# Patient Record
Sex: Male | Born: 1985 | Hispanic: No | Marital: Single | Smoking: Former smoker
Health system: Southern US, Community
[De-identification: ages and names within clinical notes are randomized; demographics above are authoritative.]

## PROBLEM LIST (undated history)

## (undated) HISTORY — PX: CHOLECYSTECTOMY: SHX55

## (undated) HISTORY — PX: TONSILLECTOMY: SUR1361

## (undated) HISTORY — PX: FEMUR SURGERY: SHX943

## (undated) HISTORY — PX: FACIAL FRACTURE SURGERY: SHX1570

---
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
## 2005-03-05 ENCOUNTER — Emergency Department: Payer: Self-pay | Admitting: Emergency Medicine

## 2005-05-16 ENCOUNTER — Emergency Department: Payer: Self-pay | Admitting: Emergency Medicine

## 2005-05-21 ENCOUNTER — Ambulatory Visit: Payer: Self-pay | Admitting: Vascular Surgery

## 2005-07-23 ENCOUNTER — Emergency Department: Payer: Self-pay | Admitting: Emergency Medicine

## 2005-10-06 ENCOUNTER — Emergency Department: Payer: Self-pay | Admitting: General Practice

## 2005-11-24 ENCOUNTER — Emergency Department: Payer: Self-pay | Admitting: Emergency Medicine

## 2006-05-11 ENCOUNTER — Emergency Department: Payer: Self-pay | Admitting: Emergency Medicine

## 2006-05-11 ENCOUNTER — Emergency Department (HOSPITAL_COMMUNITY): Admission: EM | Admit: 2006-05-11 | Discharge: 2006-05-11 | Payer: Self-pay | Admitting: Emergency Medicine

## 2006-06-21 ENCOUNTER — Emergency Department: Payer: Self-pay | Admitting: Internal Medicine

## 2006-12-12 ENCOUNTER — Emergency Department: Payer: Self-pay

## 2008-11-11 ENCOUNTER — Emergency Department (HOSPITAL_COMMUNITY): Admission: EM | Admit: 2008-11-11 | Discharge: 2008-11-11 | Payer: Self-pay | Admitting: Emergency Medicine

## 2012-02-21 ENCOUNTER — Emergency Department: Payer: Self-pay | Admitting: Emergency Medicine

## 2012-02-21 IMAGING — CR RIGHT FOOT COMPLETE - 3+ VIEW
1 series · 3 of 3 positions shown · non-contrast
Comparison: none

REASON FOR EXAM: rt foot
COMMENTS:   LMP: (Male)

[Series 1: x foot ap right · 0.14mm/px · 3 of 3 slices shown]
[im 1/3]
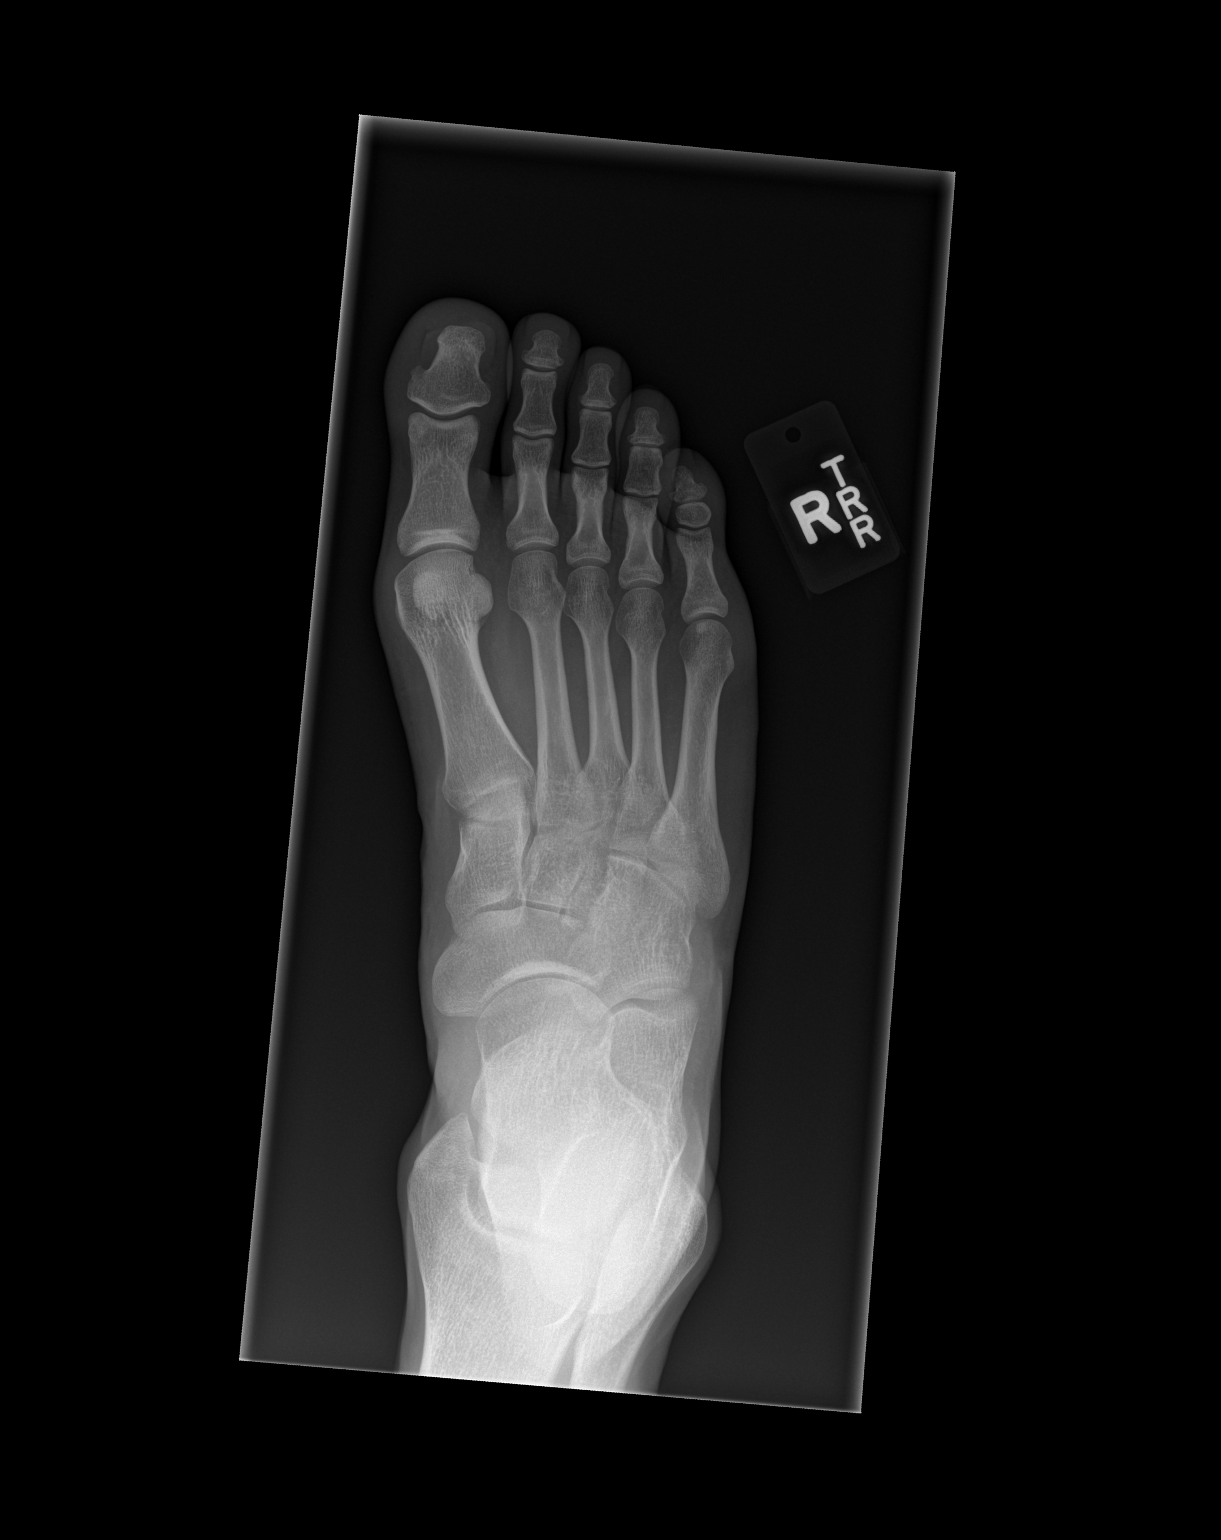
[im 2/3]
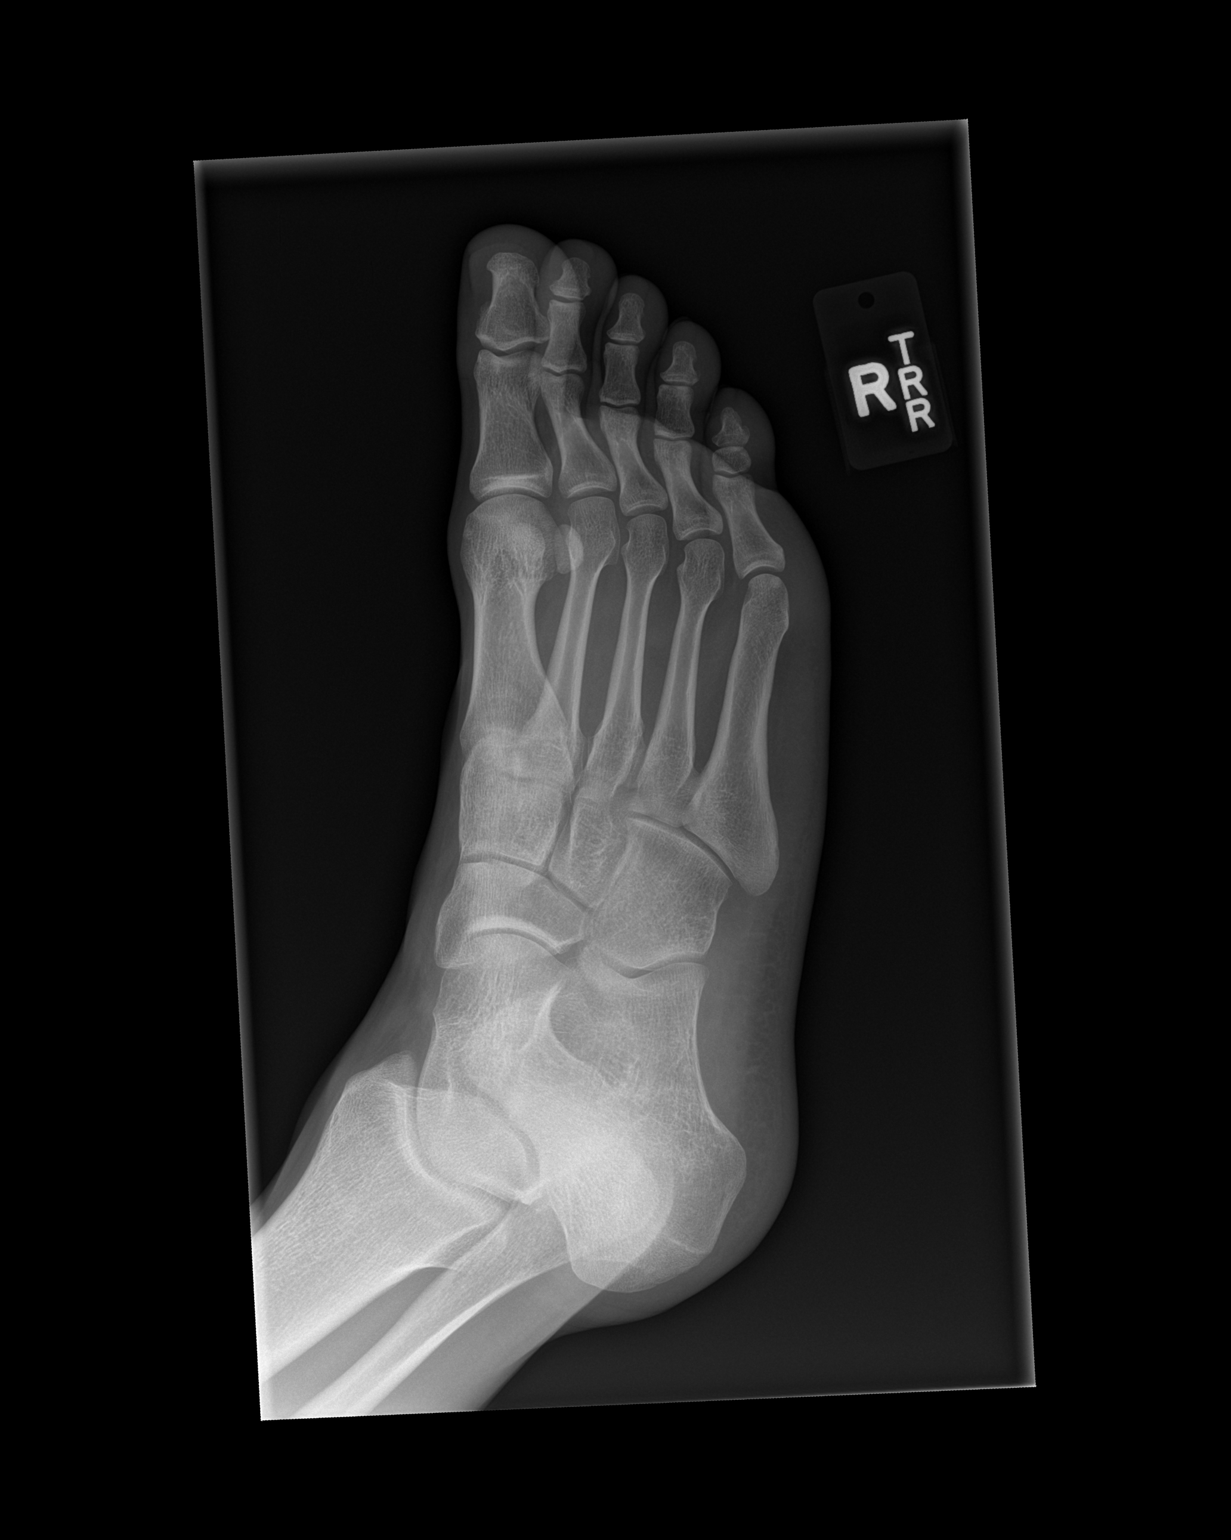
[im 3/3]
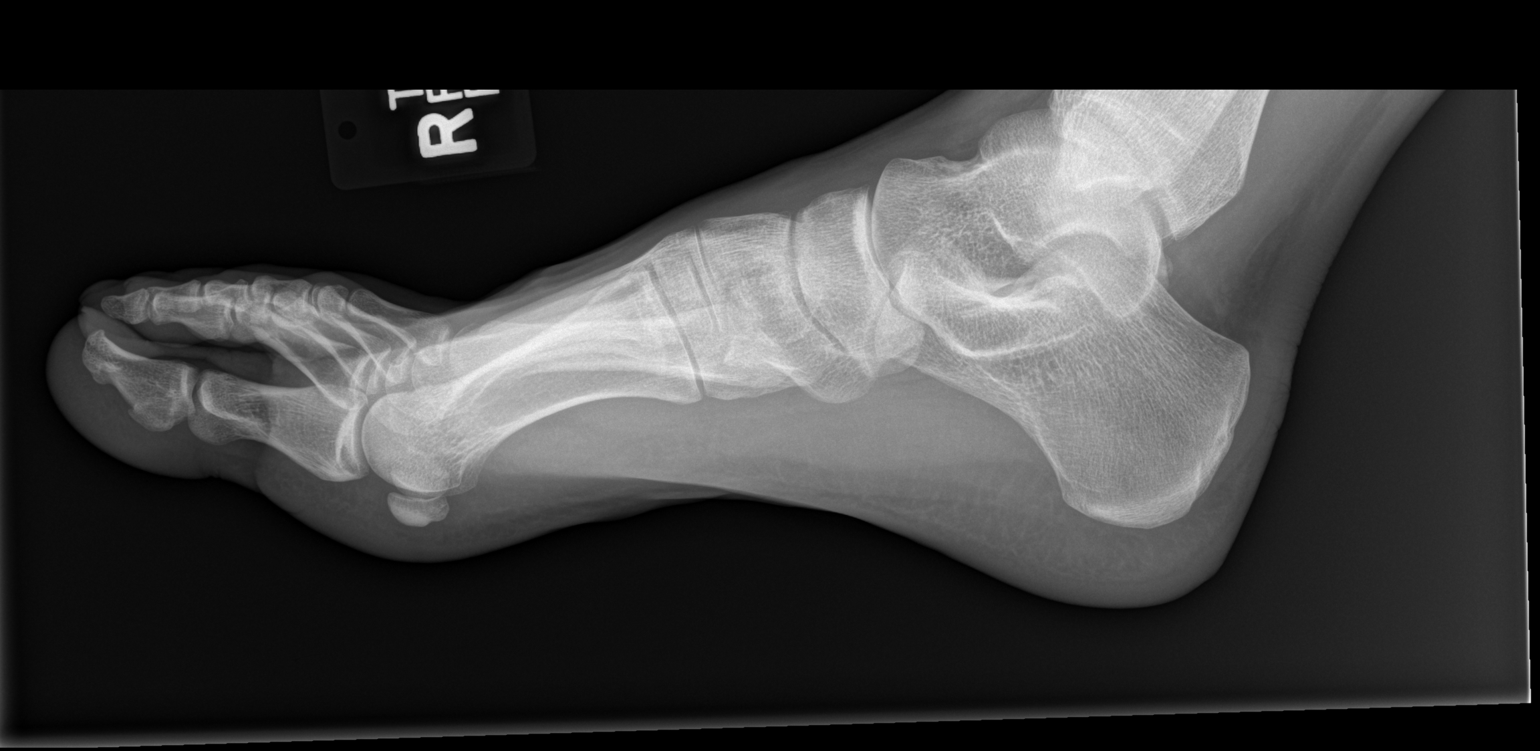

[3 of 3 positions shown; findings below may reference images not displayed]

PROCEDURE:     DXR - DXR FOOT RT COMPLETE W/OBLIQUES  - [DATE]  [DATE]

RESULT:     Three views of the right foot are submitted.

The bones are adequately mineralized. There is no evidence of an acute
fracture or dislocation. The interphalangeal joints and the
metatarsophalangeal joints appear normal. The tarsometatarsal joints and
intertarsal joints also appear normal. The overlying soft tissues exhibit no
radiodensities or lucencies.
IMPRESSION: No acute bony abnormality of the right foot is
demonstrated.

[REDACTED]

## 2012-04-27 ENCOUNTER — Emergency Department (HOSPITAL_COMMUNITY)
Admission: EM | Admit: 2012-04-27 | Discharge: 2012-04-27 | Disposition: A | Discharge status: Home / Self Care | Payer: Self-pay | Attending: Emergency Medicine | Admitting: Emergency Medicine

## 2012-04-27 ENCOUNTER — Encounter (HOSPITAL_COMMUNITY): Payer: Self-pay | Admitting: *Deleted

## 2012-04-27 DIAGNOSIS — K047 Periapical abscess without sinus: Secondary | ICD-10-CM

## 2012-04-27 DIAGNOSIS — K029 Dental caries, unspecified: Secondary | ICD-10-CM | POA: Insufficient documentation

## 2012-04-27 MED ORDER — IBUPROFEN 800 MG PO TABS: 800.0000 mg | ORAL_TABLET | Freq: Three times a day (TID) | ORAL | Status: DC

## 2012-04-27 MED ORDER — OXYCODONE-ACETAMINOPHEN 5-325 MG PO TABS
2.0000 | ORAL_TABLET | Freq: Once | ORAL | Status: AC
  Administered 2012-04-27: 2 via ORAL
  Filled 2012-04-27: qty 2

## 2012-04-27 MED ORDER — OXYCODONE-ACETAMINOPHEN 5-325 MG PO TABS: 2.0000 | ORAL_TABLET | ORAL | Status: DC | PRN

## 2012-04-27 MED ORDER — PENICILLIN V POTASSIUM 500 MG PO TABS: 500.0000 mg | ORAL_TABLET | Freq: Four times a day (QID) | ORAL | Status: AC

## 2012-04-27 NOTE — ED Notes (Signed)
Patient states his wisdom tooth is growing wrong and needs surgery for it

## 2012-04-27 NOTE — ED Provider Notes (Signed)
History     CSN: [id number]  Arrival date & time 04/27/12  Tyler Hughes   First MD Initiated Contact with Patient 04/27/12 [phone]      Chief Complaint  Patient presents with  . Dental Pain    (Consider location/radiation/quality/duration/timing/severity/associated sxs/prior treatment) HPI History provided by patient. Left lower molar pain since yesterday, pain is sharp and severe and worse with any kind of chewing. No radiation of pain. No vomiting. No fevers. No trouble breathing. Does not have a dentist. History reviewed. No pertinent past medical history.  History reviewed. No pertinent past surgical history.  History reviewed. No pertinent family history.  History  Substance Use Topics  . Smoking status: Not on file  . Smokeless tobacco: Not on file  . Alcohol Use: Not on file      Review of Systems  Constitutional: Negative for fever and chills.  HENT: Positive for dental problem. Negative for neck pain and neck stiffness.   Eyes: Negative for pain.  Respiratory: Negative for shortness of breath.   Cardiovascular: Negative for chest pain.  Gastrointestinal: Negative for vomiting and abdominal pain.  Genitourinary: Negative for dysuria.  Musculoskeletal: Negative for back pain.  Skin: Negative for rash.  Neurological: Negative for headaches.  All other systems reviewed and are negative.    Allergies  Review of patient's allergies indicates no known allergies.  Home Medications  No current outpatient prescriptions on file.  BP 152/87  Pulse 66  Temp 97.4 F (36.3 C) (Oral)  Resp 20  Ht 5\' 7"  (1.702 m)  Wt 145 lb (65.772 kg)  BMI 22.71 kg/m2  SpO2 100%  Physical Exam  Constitutional: He is oriented to person, place, and time. He appears well-developed and well-nourished.  HENT:  Head: Normocephalic and atraumatic.  Mouth/Throat: Oropharynx is clear and moist.       Tender left lower second molar, no gingival swelling or fluctuance. mild trismus. Uvula  midline. No associated facial swelling or erythema   Eyes: EOM are normal. Pupils are equal, round, and reactive to light.  Neck: Neck supple.  Cardiovascular: Regular rhythm and intact distal pulses.   Pulmonary/Chest: Effort normal. No respiratory distress.  Musculoskeletal: Normal range of motion. He exhibits no edema.  Lymphadenopathy:    He has no cervical adenopathy.  Neurological: He is alert and oriented to person, place, and time.  Skin: Skin is warm and dry.    ED Course  Procedures (including critical care time)  Patient adamantly declines dental block. He agrees to return if he changes his mind...  Percocet provided  Dental referral provided. Antibiotics provided. Patient states understanding discharge and followup instructions.  MDM   Dental pain consistent with abscess.   Vital signs and nursing notes reviewed and considered.       Sunnie Nielsen, MD 04/27/12 [phone]

## 2012-05-01 MED FILL — Oxycodone w/ Acetaminophen Tab 5-325 MG: ORAL | Qty: 6 | Status: AC

## 2014-12-18 ENCOUNTER — Encounter: Payer: Self-pay | Admitting: Emergency Medicine

## 2014-12-18 ENCOUNTER — Emergency Department
Admission: EM | Admit: 2014-12-18 | Discharge: 2014-12-18 | Disposition: A | Discharge status: Home / Self Care | Payer: Self-pay | Attending: Emergency Medicine | Admitting: Emergency Medicine

## 2014-12-18 DIAGNOSIS — Z720 Tobacco use: Secondary | ICD-10-CM | POA: Insufficient documentation

## 2014-12-18 DIAGNOSIS — Z791 Long term (current) use of non-steroidal anti-inflammatories (NSAID): Secondary | ICD-10-CM | POA: Insufficient documentation

## 2014-12-18 DIAGNOSIS — R109 Unspecified abdominal pain: Secondary | ICD-10-CM | POA: Insufficient documentation

## 2014-12-18 DIAGNOSIS — Z9049 Acquired absence of other specified parts of digestive tract: Secondary | ICD-10-CM | POA: Insufficient documentation

## 2014-12-18 LAB — URINALYSIS COMPLETE WITH MICROSCOPIC (ARMC ONLY)
Bacteria, UA: NONE SEEN
Bilirubin Urine: NEGATIVE
Glucose, UA: NEGATIVE mg/dL
Hgb urine dipstick: NEGATIVE
KETONES UR: NEGATIVE mg/dL
Leukocytes, UA: NEGATIVE
NITRITE: NEGATIVE
PROTEIN: NEGATIVE mg/dL
SPECIFIC GRAVITY, URINE: 1.026 (ref 1.005–1.030)
pH: 6 (ref 5.0–8.0)

## 2014-12-18 MED ORDER — TRAMADOL HCL 50 MG PO TABS: 50.0000 mg | ORAL_TABLET | Freq: Four times a day (QID) | ORAL | Status: DC | PRN

## 2014-12-18 MED ORDER — CYCLOBENZAPRINE HCL 10 MG PO TABS: 10.0000 mg | ORAL_TABLET | Freq: Three times a day (TID) | ORAL | Status: DC | PRN

## 2014-12-18 NOTE — ED Provider Notes (Signed)
[facility] Emergency Department Provider Note  ____________________________________________  Time seen: Approximately 3:22 PM  I have reviewed the triage vital signs and the nursing notes.   HISTORY  Chief Complaint Back Pain    HPI Tyler Hughes is a 29 y.o. male complaining of left flank pain for 2 days. 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 Patient notes onset yesterday went to bed and pain and try to tough it out but one of work today. Patient states the pain increased since he left work and come to be evaluated. Denies any loss of sensation or loss of movement of the upper extremities. Patient states taking over-the-counter anti-inflammatory for any relief. Patient is rating his pain as a sharp 5/10.   History reviewed. No pertinent past medical history.  There are no active problems to display for this patient.   Past Surgical History  Procedure Laterality Date  . Cholecystectomy    . Femur surgery    . Facial fracture surgery      Current Outpatient Rx  Name  Route  Sig  Dispense  Refill  . cyclobenzaprine (FLEXERIL) 10 MG tablet   Oral   Take 1 tablet (10 mg total) by mouth every 8 (eight) hours as needed for muscle spasms.   15 tablet   0   . ibuprofen (ADVIL,MOTRIN) 800 MG tablet   Oral   Take 1 tablet (800 mg total) by mouth 3 (three) times daily.   21 tablet   0   . oxyCODONE-acetaminophen (PERCOCET/ROXICET) 5-325 MG per tablet   Oral   Take 2 tablets by mouth every 4 (four) hours as needed for pain.   6 tablet   0   . traMADol (ULTRAM) 50 MG tablet   Oral   Take 1 tablet (50 mg total) by mouth every 6 (six) hours as needed for moderate pain.   12 tablet   0     Allergies Review of patient's allergies indicates no known allergies.  No family history on file.  Social History Social History  Substance Use Topics  . Smoking status: Current Every Day  Smoker -- 0.50 packs/day    Types: Cigarettes  . Smokeless tobacco: None  . Alcohol Use: Yes    Review of Systems Constitutional: No fever/chills Eyes: No visual changes. ENT: No sore throat. Cardiovascular: Denies chest pain. Respiratory: Denies shortness of breath. Gastrointestinal: No abdominal pain.  No nausea, no vomiting.  No diarrhea.  No constipation. Genitourinary: Negative for dysuria. Musculoskeletal: Left flank pain Skin: Negative for rash. Neurological: Negative for headaches, focal weakness or numbness. 10-point ROS otherwise negative.  ____________________________________________   PHYSICAL EXAM:  VITAL SIGNS: ED Triage Vitals  Enc Vitals Group     BP 12/18/14 1434 123/84 mmHg     Pulse Rate 12/18/14 1434 58     Resp 12/18/14 1434 18     Temp 12/18/14 1434 98.3 F (36.8 C)     Temp Source 12/18/14 1434 Oral     SpO2 12/18/14 1434 98 %     Weight 12/18/14 1434 155 lb (70.308 kg)     Height 12/18/14 1434 5\' 7"  (1.702 m)     Head Cir --      Peak Flow --      Pain Score 12/18/14 1435 5     Pain Loc --      Pain Edu? --      Excl. in GC? --  Constitutional: Alert and oriented. Well appearing and in no acute distress. Eyes: Conjunctivae are normal. PERRL. EOMI. Head: Atraumatic. Nose: No congestion/rhinnorhea. Mouth/Throat: Mucous membranes are moist.  Oropharynx non-erythematous. Neck: No stridor. No cervical spine tenderness to palpation. Hematological/Lymphatic/Immunilogical: No cervical lymphadenopathy. Cardiovascular: Normal rate, regular rhythm. Grossly normal heart sounds.  Good peripheral circulation. Respiratory: Normal respiratory effort.  No retractions. Lungs CTAB. Gastrointestinal: Soft and nontender. No distention. No abdominal bruits. No CVA tenderness. Musculoskeletal: No spinal deformity no CVA guarding no guarding palpation spinal processes. Patient has some moderate guarding palpation of the latissimus muscle group on the left  side. Patient had full nuchal range of motion of the back in full nuchal range of motion of extremities.  Neurologic:  Normal speech and language. No gross focal neurologic deficits are appreciated. No gait instability. Skin:  Skin is warm, dry and intact. No rash noted. Psychiatric: Mood and affect are normal. Speech and behavior are normal.  ____________________________________________   LABS (all labs ordered are listed, but only abnormal results are displayed)  Labs Reviewed  URINALYSIS COMPLETEWITH MICROSCOPIC ([facility] ONLY) - Abnormal; Notable for the following:    Color, Urine YELLOW (*)    APPearance CLEAR (*)    Squamous Epithelial / LPF 0-5 (*)    All other components within normal limits   ____________________________________________  EKG   ____________________________________________  RADIOLOGY   ____________________________________________   PROCEDURES  Procedure(s) performed: None  Critical Care performed: No  ____________________________________________   INITIAL IMPRESSION / ASSESSMENT AND PLAN / ED COURSE  Pertinent labs & imaging results that were available during my care of the patient were reviewed by me and considered in my medical decision making (see chart for details).  Left back strain. Patient given advice on supportive home care. Patient given a prescription for tramadol and Flexeril taken as directed. Patient given a work excuse for today. ____________________________________________   FINAL CLINICAL IMPRESSION(S) / ED DIAGNOSES  Final diagnoses:  Acute left flank pain      Joni Reining, PA-C 12/18/14 1530  Minna Antis, MD 12/18/14 1535

## 2014-12-18 NOTE — ED Notes (Signed)
Denies dysuria 

## 2015-01-12 ENCOUNTER — Emergency Department
Admission: EM | Admit: 2015-01-12 | Discharge: 2015-01-12 | Disposition: A | Discharge status: Home / Self Care | Payer: Self-pay | Attending: Student | Admitting: Student

## 2015-01-12 DIAGNOSIS — J0100 Acute maxillary sinusitis, unspecified: Secondary | ICD-10-CM | POA: Insufficient documentation

## 2015-01-12 DIAGNOSIS — Z79899 Other long term (current) drug therapy: Secondary | ICD-10-CM | POA: Insufficient documentation

## 2015-01-12 DIAGNOSIS — Z720 Tobacco use: Secondary | ICD-10-CM | POA: Insufficient documentation

## 2015-01-12 MED ORDER — IBUPROFEN 800 MG PO TABS: 800.0000 mg | ORAL_TABLET | Freq: Three times a day (TID) | ORAL | Status: DC | PRN

## 2015-01-12 MED ORDER — FEXOFENADINE-PSEUDOEPHED ER 60-120 MG PO TB12: 1.0000 | ORAL_TABLET | Freq: Two times a day (BID) | ORAL | Status: DC

## 2015-01-12 MED ORDER — BENZONATATE 100 MG PO CAPS: 100.0000 mg | ORAL_CAPSULE | Freq: Three times a day (TID) | ORAL | Status: AC | PRN

## 2015-01-12 NOTE — ED Provider Notes (Signed)
[facility] Emergency Department Provider Note  ____________________________________________  Time seen: Approximately 9:22 AM  I have reviewed the triage vital signs and the nursing notes.   HISTORY  Chief Complaint URI    HPI Tyler Hughes is a 29 y.o. male patient complaining of body aches nasal congestion sinus pressure and a nonproductive cough onset yesterday. Patient stated he had a fever last night which finally broke soaking her sheets. Patient denies any nausea vomiting diarrhea associated this complaint. Patient is rating his pain discomfort as a 5/10. No palliative measures taken for this complaint.   History reviewed. No pertinent past medical history.  There are no active problems to display for this patient.   Past Surgical History  Procedure Laterality Date  . Cholecystectomy    . Femur surgery    . Facial fracture surgery    . Tonsillectomy      Current Outpatient Rx  Name  Route  Sig  Dispense  Refill  . benzonatate (TESSALON PERLES) 100 MG capsule   Oral   Take 1 capsule (100 mg total) by mouth 3 (three) times daily as needed for cough.   15 capsule   0   . cyclobenzaprine (FLEXERIL) 10 MG tablet   Oral   Take 1 tablet (10 mg total) by mouth every 8 (eight) hours as needed for muscle spasms.   15 tablet   0   . fexofenadine-pseudoephedrine (ALLEGRA-D) 60-120 MG per tablet   Oral   Take 1 tablet by mouth 2 (two) times daily.   30 tablet   0   . ibuprofen (ADVIL,MOTRIN) 800 MG tablet   Oral   Take 1 tablet (800 mg total) by mouth 3 (three) times daily.   21 tablet   0   . ibuprofen (ADVIL,MOTRIN) 800 MG tablet   Oral   Take 1 tablet (800 mg total) by mouth every 8 (eight) hours as needed for moderate pain.   15 tablet   0   . oxyCODONE-acetaminophen (PERCOCET/ROXICET) 5-325 MG per tablet   Oral   Take 2 tablets by mouth every 4 (four) hours as needed for pain.   6 tablet   0   . traMADol (ULTRAM) 50 MG  tablet   Oral   Take 1 tablet (50 mg total) by mouth every 6 (six) hours as needed for moderate pain.   12 tablet   0     Allergies Review of patient's allergies indicates no known allergies.  No family history on file.  Social History Social History  Substance Use Topics  . Smoking status: Current Every Day Smoker -- 0.50 packs/day    Types: Cigarettes  . Smokeless tobacco: None  . Alcohol Use: Yes    Review of SystemsConstitutional: No fever/chills Eyes: No visual changes. ENT: No sore throat. Cardiovascular: Denies chest pain. Respiratory: Denies shortness of breath. Gastrointestinal: No abdominal pain.  No nausea, no vomiting.  No diarrhea.  No constipation. Genitourinary: Negative for dysuria. Musculoskeletal: Negative for back pain. Skin: Negative for rash. Neurological: Negative for headaches, focal weakness or numbness. 10-point ROS otherwise negative.  ____________________________________________   PHYSICAL EXAM:  VITAL SIGNS: ED Triage Vitals  Enc Vitals Group     BP 01/12/15 0904 131/85 mmHg     Pulse Rate 01/12/15 0904 68     Resp 01/12/15 0904 16     Temp 01/12/15 0904 97.7 F (36.5 C)     Temp Source 01/12/15 0904 Oral     SpO2 01/12/15  0904 97 %     Weight 01/12/15 0904 155 lb (70.308 kg)     Height 01/12/15 0904 5\' 7"  (1.702 m)     Head Cir --      Peak Flow --      Pain Score 01/12/15 0905 5     Pain Loc --      Pain Edu? --      Excl. in GC? --    Constitutional: Alert and oriented. Well appearing and in no acute distress. Eyes: Conjunctivae are normal. PERRL. EOMI. Head: Atraumatic. Nose: No congestion/rhinnorhea. Mouth/Throat: Mucous membranes are moist.  Oropharynx non-erythematous. Neck: No stridor.   Hematological/Lymphatic/Immunilogical: No cervical lymphadenopathy. Cardiovascular: Normal rate, regular rhythm. Grossly normal heart sounds.  Good peripheral circulation. Respiratory: Normal respiratory effort.  No retractions.  Lungs CTAB. Gastrointestinal: Soft and nontender. No distention. No abdominal bruits. No CVA tenderness. Musculoskeletal: No lower extremity tenderness nor edema.  No joint effusions. Neurologic:  Normal speech and language. No gross focal neurologic deficits are appreciated. No gait instability. Skin:  Skin is warm, dry and intact. No rash noted. Psychiatric: Mood and affect are normal. Speech and behavior are normal.  ____________________________________________   LABS (all labs ordered are listed, but only abnormal results are displayed)  Labs Reviewed - No data to display ____________________________________________  EKG   ____________________________________________  RADIOLOGY   ____________________________________________   PROCEDURES  Procedure(s) performed: None  Critical Care performed: No  ____________________________________________   INITIAL IMPRESSION / ASSESSMENT AND PLAN / ED COURSE  Pertinent labs & imaging results that were available during my care of the patient were reviewed by me and considered in my medical decision making (see chart for details).  Viral URI. Patient given 60 mg Toradol IM for body aches and pain. Patient discharge with Allegra-D, Tessalon Perles, and ibuprofen. ____________________________________________   FINAL CLINICAL IMPRESSION(S) / ED DIAGNOSES  Final diagnoses:  Subacute maxillary sinusitis      Joni Reining, PA-C 01/12/15 [id number]  Gayla Doss, MD 01/12/15 1450

## 2015-01-12 NOTE — ED Notes (Signed)
Pt c/o bodyaches with cough congestion since yesterday

## 2015-01-12 NOTE — ED Notes (Signed)
Patient discharged with instructions given, prescriptions given. Patient verbalized understanding of instructions given.

## 2015-02-19 ENCOUNTER — Emergency Department
Admission: EM | Admit: 2015-02-19 | Discharge: 2015-02-19 | Disposition: A | Discharge status: Home / Self Care | Payer: No Typology Code available for payment source | Attending: Emergency Medicine | Admitting: Emergency Medicine

## 2015-02-19 ENCOUNTER — Emergency Department: Payer: No Typology Code available for payment source

## 2015-02-19 ENCOUNTER — Encounter: Payer: Self-pay | Admitting: *Deleted

## 2015-02-19 DIAGNOSIS — Z23 Encounter for immunization: Secondary | ICD-10-CM | POA: Insufficient documentation

## 2015-02-19 DIAGNOSIS — S52501A Unspecified fracture of the lower end of right radius, initial encounter for closed fracture: Secondary | ICD-10-CM | POA: Diagnosis not present

## 2015-02-19 DIAGNOSIS — Z720 Tobacco use: Secondary | ICD-10-CM | POA: Diagnosis not present

## 2015-02-19 DIAGNOSIS — Y998 Other external cause status: Secondary | ICD-10-CM | POA: Insufficient documentation

## 2015-02-19 DIAGNOSIS — Y9289 Other specified places as the place of occurrence of the external cause: Secondary | ICD-10-CM | POA: Insufficient documentation

## 2015-02-19 DIAGNOSIS — Y9389 Activity, other specified: Secondary | ICD-10-CM | POA: Insufficient documentation

## 2015-02-19 DIAGNOSIS — Z79899 Other long term (current) drug therapy: Secondary | ICD-10-CM | POA: Insufficient documentation

## 2015-02-19 DIAGNOSIS — S46011A Strain of muscle(s) and tendon(s) of the rotator cuff of right shoulder, initial encounter: Secondary | ICD-10-CM | POA: Insufficient documentation

## 2015-02-19 DIAGNOSIS — S4991XA Unspecified injury of right shoulder and upper arm, initial encounter: Secondary | ICD-10-CM | POA: Diagnosis present

## 2015-02-19 DIAGNOSIS — S7001XA Contusion of right hip, initial encounter: Secondary | ICD-10-CM | POA: Diagnosis not present

## 2015-02-19 DIAGNOSIS — T148XXA Other injury of unspecified body region, initial encounter: Secondary | ICD-10-CM

## 2015-02-19 MED ORDER — TETANUS-DIPHTH-ACELL PERTUSSIS 5-2.5-18.5 LF-MCG/0.5 IM SUSP
0.5000 mL | Freq: Once | INTRAMUSCULAR | Status: AC
  Administered 2015-02-19: 0.5 mL via INTRAMUSCULAR
  Filled 2015-02-19: qty 0.5

## 2015-02-19 MED ORDER — ONDANSETRON HCL 4 MG/2ML IJ SOLN
4.0000 mg | Freq: Once | INTRAMUSCULAR | Status: AC
  Administered 2015-02-19: 4 mg via INTRAVENOUS
  Filled 2015-02-19: qty 2

## 2015-02-19 MED ORDER — FENTANYL CITRATE (PF) 100 MCG/2ML IJ SOLN
25.0000 ug | Freq: Once | INTRAMUSCULAR | Status: AC
  Administered 2015-02-19: 25 ug via INTRAVENOUS
  Filled 2015-02-19: qty 2

## 2015-02-19 MED ORDER — GI COCKTAIL ~~LOC~~
30.0000 mL | Freq: Once | ORAL | Status: AC
  Administered 2015-02-19: 30 mL via ORAL

## 2015-02-19 MED ORDER — OXYCODONE-ACETAMINOPHEN 5-325 MG PO TABS
2.0000 | ORAL_TABLET | Freq: Once | ORAL | Status: AC
  Administered 2015-02-19: 2 via ORAL

## 2015-02-19 MED ORDER — HYDROCODONE-ACETAMINOPHEN 5-325 MG PO TABS: 1.0000 | ORAL_TABLET | ORAL | Status: DC | PRN

## 2015-02-19 MED ORDER — OXYCODONE-ACETAMINOPHEN 5-325 MG PO TABS
ORAL_TABLET | ORAL | Status: AC
  Administered 2015-02-19: 2 via ORAL
  Filled 2015-02-19: qty 2

## 2015-02-19 MED ORDER — MORPHINE SULFATE (PF) 4 MG/ML IV SOLN
4.0000 mg | Freq: Once | INTRAVENOUS | Status: AC
  Administered 2015-02-19: 4 mg via INTRAVENOUS
  Filled 2015-02-19: qty 1

## 2015-02-19 MED ORDER — DOCUSATE SODIUM 100 MG PO CAPS: ORAL_CAPSULE | ORAL | Status: DC

## 2015-02-19 MED ORDER — FAMOTIDINE 20 MG PO TABS
40.0000 mg | ORAL_TABLET | Freq: Once | ORAL | Status: AC
  Administered 2015-02-19: 40 mg via ORAL
  Filled 2015-02-19: qty 2

## 2015-02-19 MED ORDER — GI COCKTAIL ~~LOC~~
ORAL | Status: AC
  Administered 2015-02-19: 30 mL via ORAL
  Filled 2015-02-19: qty 30
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
## 2015-02-19 NOTE — ED Provider Notes (Signed)
Patient requesting slightly more pain medicine before he departs. He is fully awake and alert, I evaluated his right wrist is splinted appropriately with good use of all digits, normal capillary refill and normal sensation in all digits of the right hand. I will give a day patient additional 25 g fentanyl, discussed that he needs to wait about 20 minutes before we will discharge and thereafter once pain is improved. He has been provided a prescription pain medication by Dr. York CeriseForbach, and understands she is appropriately and never more than prescribed.    Sharyn CreamerMark Quale, MD 02/19/15 [phone]
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
## 2015-02-19 NOTE — ED Notes (Signed)
Pt. 

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
## 2015-02-24 ENCOUNTER — Encounter
Admission: RE | Disposition: A | Discharge status: Home / Self Care | Payer: Self-pay | Source: Ambulatory Visit | Attending: Orthopedic Surgery

## 2015-02-24 ENCOUNTER — Encounter: Payer: Self-pay | Admitting: *Deleted

## 2015-02-24 ENCOUNTER — Ambulatory Visit: Payer: Self-pay | Admitting: Certified Registered Nurse Anesthetist

## 2015-02-24 ENCOUNTER — Ambulatory Visit: Payer: Self-pay

## 2015-02-24 ENCOUNTER — Ambulatory Visit
Admission: RE | Admit: 2015-02-24 | Discharge: 2015-02-24 | Disposition: A | Discharge status: Home / Self Care | Payer: Self-pay | Source: Ambulatory Visit | Attending: Orthopedic Surgery | Admitting: Orthopedic Surgery

## 2015-02-24 DIAGNOSIS — F17210 Nicotine dependence, cigarettes, uncomplicated: Secondary | ICD-10-CM | POA: Insufficient documentation

## 2015-02-24 DIAGNOSIS — Z8781 Personal history of (healed) traumatic fracture: Secondary | ICD-10-CM

## 2015-02-24 DIAGNOSIS — S52511A Displaced fracture of right radial styloid process, initial encounter for closed fracture: Secondary | ICD-10-CM | POA: Insufficient documentation

## 2015-02-24 DIAGNOSIS — K219 Gastro-esophageal reflux disease without esophagitis: Secondary | ICD-10-CM | POA: Insufficient documentation

## 2015-02-24 DIAGNOSIS — Z8249 Family history of ischemic heart disease and other diseases of the circulatory system: Secondary | ICD-10-CM | POA: Insufficient documentation

## 2015-02-24 DIAGNOSIS — Y92410 Unspecified street and highway as the place of occurrence of the external cause: Secondary | ICD-10-CM | POA: Insufficient documentation

## 2015-02-24 DIAGNOSIS — Z8049 Family history of malignant neoplasm of other genital organs: Secondary | ICD-10-CM | POA: Insufficient documentation

## 2015-02-24 DIAGNOSIS — Y9389 Activity, other specified: Secondary | ICD-10-CM | POA: Insufficient documentation

## 2015-02-24 DIAGNOSIS — Z9049 Acquired absence of other specified parts of digestive tract: Secondary | ICD-10-CM | POA: Insufficient documentation

## 2015-02-24 DIAGNOSIS — Z967 Presence of other bone and tendon implants: Secondary | ICD-10-CM | POA: Insufficient documentation

## 2015-02-24 DIAGNOSIS — Z9889 Other specified postprocedural states: Secondary | ICD-10-CM

## 2015-02-24 HISTORY — PX: OPEN REDUCTION INTERNAL FIXATION (ORIF) DISTAL RADIAL FRACTURE: SHX5989

## 2015-02-24 SURGERY — OPEN REDUCTION INTERNAL FIXATION (ORIF) DISTAL RADIUS FRACTURE
Anesthesia: General | Site: Hand | Laterality: Right | Wound class: Clean

## 2015-02-24 MED ORDER — ONDANSETRON HCL 4 MG PO TABS: 4.0000 mg | ORAL_TABLET | Freq: Four times a day (QID) | ORAL | Status: DC | PRN

## 2015-02-24 MED ORDER — FENTANYL CITRATE (PF) 100 MCG/2ML IJ SOLN
INTRAMUSCULAR | Status: AC
  Administered 2015-02-24: 25 ug via INTRAVENOUS
  Filled 2015-02-24: qty 2

## 2015-02-24 MED ORDER — FENTANYL CITRATE (PF) 100 MCG/2ML IJ SOLN
25.0000 ug | INTRAMUSCULAR | Status: DC | PRN
  Administered 2015-02-24 (×4): 25 ug via INTRAVENOUS

## 2015-02-24 MED ORDER — MIDAZOLAM HCL 2 MG/2ML IJ SOLN
INTRAMUSCULAR | Status: DC | PRN
  Administered 2015-02-24: 2 mg via INTRAVENOUS

## 2015-02-24 MED ORDER — PROPOFOL 10 MG/ML IV BOLUS
INTRAVENOUS | Status: DC | PRN
  Administered 2015-02-24: 200 mg via INTRAVENOUS

## 2015-02-24 MED ORDER — HYDROCODONE-ACETAMINOPHEN 5-325 MG PO TABS: 1.0000 | ORAL_TABLET | Freq: Four times a day (QID) | ORAL | Status: DC | PRN

## 2015-02-24 MED ORDER — LACTATED RINGERS IV SOLN
INTRAVENOUS | Status: DC
  Administered 2015-02-24: 11:00:00 via INTRAVENOUS

## 2015-02-24 MED ORDER — METOCLOPRAMIDE HCL 10 MG PO TABS: 5.0000 mg | ORAL_TABLET | Freq: Three times a day (TID) | ORAL | Status: DC | PRN

## 2015-02-24 MED ORDER — PROMETHAZINE HCL 25 MG/ML IJ SOLN: 6.2500 mg | INTRAMUSCULAR | Status: DC | PRN

## 2015-02-24 MED ORDER — HYDROCODONE-ACETAMINOPHEN 5-325 MG PO TABS: 1.0000 | ORAL_TABLET | ORAL | Status: DC | PRN

## 2015-02-24 MED ORDER — CEFAZOLIN SODIUM 1-5 GM-% IV SOLN
INTRAVENOUS | Status: DC | PRN
  Administered 2015-02-24: 1 g via INTRAVENOUS

## 2015-02-24 MED ORDER — ACETAMINOPHEN 10 MG/ML IV SOLN
INTRAVENOUS | Status: DC | PRN
  Administered 2015-02-24: 1000 mg via INTRAVENOUS

## 2015-02-24 MED ORDER — FENTANYL CITRATE (PF) 100 MCG/2ML IJ SOLN
INTRAMUSCULAR | Status: DC | PRN
  Administered 2015-02-24: 100 ug via INTRAVENOUS

## 2015-02-24 MED ORDER — ONDANSETRON HCL 4 MG/2ML IJ SOLN
INTRAMUSCULAR | Status: DC | PRN
  Administered 2015-02-24: 4 mg via INTRAVENOUS

## 2015-02-24 MED ORDER — NEOMYCIN-POLYMYXIN B GU 40-200000 IR SOLN
Status: AC
  Filled 2015-02-24: qty 2

## 2015-02-24 MED ORDER — DEXAMETHASONE SODIUM PHOSPHATE 4 MG/ML IJ SOLN
INTRAMUSCULAR | Status: DC | PRN
  Administered 2015-02-24: 5 mg via INTRAVENOUS

## 2015-02-24 MED ORDER — SODIUM CHLORIDE 0.9 % IV SOLN: INTRAVENOUS | Status: DC

## 2015-02-24 MED ORDER — ONDANSETRON HCL 4 MG/2ML IJ SOLN: 4.0000 mg | Freq: Four times a day (QID) | INTRAMUSCULAR | Status: DC | PRN

## 2015-02-24 MED ORDER — HYDROMORPHONE HCL 1 MG/ML IJ SOLN
INTRAMUSCULAR | Status: AC
  Administered 2015-02-24: 0.5 mg via INTRAVENOUS
  Filled 2015-02-24: qty 1

## 2015-02-24 MED ORDER — METOCLOPRAMIDE HCL 5 MG/ML IJ SOLN: 5.0000 mg | Freq: Three times a day (TID) | INTRAMUSCULAR | Status: DC | PRN

## 2015-02-24 MED ORDER — HYDROMORPHONE HCL 1 MG/ML IJ SOLN
INTRAMUSCULAR | Status: AC
  Filled 2015-02-24: qty 1

## 2015-02-24 MED ORDER — HYDROMORPHONE HCL 1 MG/ML IJ SOLN
0.2500 mg | INTRAMUSCULAR | Status: DC | PRN
  Administered 2015-02-24 (×3): 0.5 mg via INTRAVENOUS

## 2015-02-24 MED ORDER — KETOROLAC TROMETHAMINE 30 MG/ML IJ SOLN
INTRAMUSCULAR | Status: DC | PRN
  Administered 2015-02-24: 30 mg via INTRAVENOUS

## 2015-02-24 MED ORDER — LIDOCAINE HCL (CARDIAC) 20 MG/ML IV SOLN
INTRAVENOUS | Status: DC | PRN
  Administered 2015-02-24: 100 mg via INTRAVENOUS

## 2015-02-24 SURGICAL SUPPLY — 32 items
BANDAGE ELASTIC 4 CLIP ST LF (GAUZE/BANDAGES/DRESSINGS) ×3 IMPLANT
BIT DRILL 2.5X110 QC LCP DISP (BIT) ×3 IMPLANT
BIT DRILL QC 3.5X110 (BIT) ×3 IMPLANT
CANISTER SUCT 1200ML W/VALVE (MISCELLANEOUS) ×3 IMPLANT
CHLORAPREP W/TINT 26ML (MISCELLANEOUS) ×3 IMPLANT
DRAPE FLUOR MINI C-ARM 54X84 (DRAPES) ×3 IMPLANT
GAUZE PETRO XEROFOAM 1X8 (MISCELLANEOUS) ×6 IMPLANT
GAUZE SPONGE 4X4 12PLY STRL (GAUZE/BANDAGES/DRESSINGS) ×3 IMPLANT
GLOVE BIOGEL PI IND STRL 9 (GLOVE) ×1 IMPLANT
GLOVE BIOGEL PI INDICATOR 9 (GLOVE) ×2
GLOVE SURG ORTHO 9.0 STRL STRW (GLOVE) ×3 IMPLANT
GOWN SPECIALTY ULTRA XL (MISCELLANEOUS) ×3 IMPLANT
GOWN STRL REUS W/ TWL LRG LVL3 (GOWN DISPOSABLE) ×1 IMPLANT
GOWN STRL REUS W/TWL 2XL LVL3 (GOWN DISPOSABLE) ×3 IMPLANT
GOWN STRL REUS W/TWL LRG LVL3 (GOWN DISPOSABLE) ×2
KIT RM TURNOVER STRD PROC AR (KITS) ×3 IMPLANT
NEEDLE FILTER BLUNT 18X 1/2SAF (NEEDLE) ×2
NEEDLE FILTER BLUNT 18X1 1/2 (NEEDLE) ×1 IMPLANT
NS IRRIG 500ML POUR BTL (IV SOLUTION) ×3 IMPLANT
PACK EXTREMITY ARMC (MISCELLANEOUS) ×3 IMPLANT
PAD CAST CTTN 4X4 STRL (SOFTGOODS) ×2 IMPLANT
PAD GROUND ADULT SPLIT (MISCELLANEOUS) ×3 IMPLANT
PADDING CAST COTTON 4X4 STRL (SOFTGOODS) ×4
SCREW CORTEX 3.5 32MM (Screw) ×2 IMPLANT
SCREW LOCK CORT ST 3.5X32 (Screw) ×1 IMPLANT
SPLINT CAST 1 STEP 3X12 (MISCELLANEOUS) ×3 IMPLANT
STOCKINETTE STRL 4IN 9604848 (GAUZE/BANDAGES/DRESSINGS) ×3 IMPLANT
SUT ETHILON 4-0 (SUTURE) ×2
SUT ETHILON 4-0 FS2 18XMFL BLK (SUTURE) ×1
SUT VICRYL 3-0 27IN (SUTURE) ×3 IMPLANT
SUTURE ETHLN 4-0 FS2 18XMF BLK (SUTURE) ×1 IMPLANT
SYR 3ML LL SCALE MARK (SYRINGE) ×3 IMPLANT

## 2015-02-24 NOTE — H&P (Signed)
Reviewed paper H+P, will be scanned into chart. No changes noted.  

## 2015-02-24 NOTE — Op Note (Signed)
02/24/2015  1:02 PM  PATIENT:  Tyler Hughes  29 y.o. male  PRE-OPERATIVE DIAGNOSIS:  distal radius fracture radial styloid  POST-OPERATIVE DIAGNOSIS:  Same  PROCEDURE:  Procedure(s): OPEN REDUCTION INTERNAL FIXATION (ORIF) DISTAL RADIAL FRACTURE (Right)  SURGEON: Leitha SchullerMichael J Amjad Fikes, MD  ASSISTANTS: None  ANESTHESIA:   general  EBL:  Total I/O In: 400 [I.V.:400] Out: 0   BLOOD ADMINISTERED:none  DRAINS: none   LOCAL MEDICATIONS USED:  NONE  SPECIMEN:  No Specimen  DISPOSITION OF SPECIMEN:  N/A  COUNTS:  YES  TOURNIQUET:   12 minutes at 250 mm mercury   IMPLANTSynthes 3.5 cortical screw 1  DICTATION: .Dragon Dictation patient brought the operating room and after adequate general anesthesia was obtained the right arm was prepped and draped in sterile fashion was turned by the upper arm. After patient identification and timeout procedures were completed tourniquet was raised to her 50 murmurs mercury. C-arm was brought in and visualization of the fracture was made and determination of incision made to make a screw perpendicular to the fracture plane a small radial volar incision was made with the subcutaneous tissues tissue spread and tendons retracted dorsally with a soft tissue protector in place the proximal cortex was drilled with 3.5 mm drill and then distally with 2.5. A 32 mm screw was then inserted after measuring this gave good compression through the leg hole getting compression at the displaced joint getting essentially anatomic reduction. Stressing the wrist under fluoroscopy showed maintenance of reduction the wound was irrigated and then closed with simple 4-0 nylon Xeroform 4 x 4's web roll and volar splint applied followed by an Ace wrap patient center comes stable condition EBL minimal  PLAN OF CARE: Discharge to home after PACU  PATIENT DISPOSITION:  PACU - hemodynamically stable.
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
## 2015-02-24 NOTE — Anesthesia Procedure Notes (Signed)
Procedure Name: LMA Insertion Date/Time: 02/24/2015 12:16 PM Performed by: Ginger CarneMICHELET, Lekha Dancer Pre-anesthesia Checklist: Patient identified, Emergency Drugs available, Suction available, Patient being monitored and Timeout performed Patient Re-evaluated:Patient Re-evaluated prior to inductionOxygen Delivery Method: Circle system utilized Preoxygenation: Pre-oxygenation with 100% oxygen Intubation Type: IV induction Ventilation: Mask ventilation without difficulty LMA: LMA inserted LMA Size: 3.0 Number of attempts: 1 Tube secured with: Tape Dental Injury: Teeth and Oropharynx as per pre-operative assessment

## 2015-02-24 NOTE — Transfer of Care (Signed)
Immediate Anesthesia Transfer of Care Note  Patient: Tyler Hughes  Procedure(s) Performed: Procedure(s): OPEN REDUCTION INTERNAL FIXATION (ORIF) DISTAL RADIAL FRACTURE (Right)  Patient Location: PACU  Anesthesia Type:General  Level of Consciousness: awake, alert  and oriented  Airway & Oxygen Therapy: Patient Spontanous Breathing and Patient connected to nasal cannula oxygen  Post-op Assessment: Report given to RN and Post -op Vital signs reviewed and stable  Post vital signs: Reviewed and stable  Last Vitals:  Filed Vitals:   02/24/15 1309  BP:   Pulse: 62  Temp: 36.2 C  Resp: 12    Complications: No apparent anesthesia complications

## 2015-02-24 NOTE — Anesthesia Postprocedure Evaluation (Signed)
  Anesthesia Post-op Note  Patient: Tyler Hughes  Procedure(s) Performed: Procedure(s): OPEN REDUCTION INTERNAL FIXATION (ORIF) DISTAL RADIAL FRACTURE (Right)  Anesthesia type:General  Patient location: PACU  Post pain: Pain level controlled  Post assessment: Post-op Vital signs reviewed, Patient's Cardiovascular Status Stable, Respiratory Function Stable, Patent Airway and No signs of Nausea or vomiting  Post vital signs: Reviewed and stable  Last Vitals:  Filed Vitals:   02/24/15 1408  BP: 133/78  Pulse: 60  Temp: 36.8 C  Resp: 12    Level of consciousness: awake, alert  and patient cooperative  Complications: No apparent anesthesia complications

## 2015-02-24 NOTE — Discharge Instructions (Signed)
AMBULATORY SURGERY  DISCHARGE INSTRUCTIONS   1) The drugs that you were given will stay in your system until tomorrow so for the next 24 hours you should not:  A) Drive an automobile B) Make any legal decisions C) Drink any alcoholic beverage   2) You may resume regular meals tomorrow.  Today it is better to start with liquids and gradually work up to solid foods.  You may eat anything you prefer, but it is better to start with liquids, then soup and crackers, and gradually work up to solid foods.   3) Please notify your doctor immediately if you have any unusual bleeding, trouble breathing, redness and pain at the surgery site, drainage, fever, or pain not relieved by medication.    4) Additional Instructions:  Keep arm elevated above level of your heart and use ice pack as much as possible.        Please contact your physician with any problems or Same Day Surgery at [phone], Monday through Friday 6 am to 4 pm, or [place] [facility] Main number at [phone].

## 2015-02-24 NOTE — Progress Notes (Signed)
Elevated arm on pillows and applied ice pack

## 2016-01-09 ENCOUNTER — Other Ambulatory Visit: Payer: Self-pay | Admitting: Gastroenterology

## 2016-01-09 DIAGNOSIS — R197 Diarrhea, unspecified: Secondary | ICD-10-CM | POA: Insufficient documentation

## 2016-01-09 DIAGNOSIS — R1084 Generalized abdominal pain: Secondary | ICD-10-CM

## 2016-01-13 ENCOUNTER — Ambulatory Visit
Admission: RE | Admit: 2016-01-13 | Discharge: 2016-01-13 | Disposition: A | Discharge status: Home / Self Care | Payer: BLUE CROSS/BLUE SHIELD | Source: Ambulatory Visit | Attending: Gastroenterology | Admitting: Gastroenterology

## 2016-01-13 DIAGNOSIS — Z9049 Acquired absence of other specified parts of digestive tract: Secondary | ICD-10-CM | POA: Diagnosis not present

## 2016-01-13 DIAGNOSIS — K7689 Other specified diseases of liver: Secondary | ICD-10-CM | POA: Insufficient documentation

## 2016-01-13 DIAGNOSIS — R1084 Generalized abdominal pain: Secondary | ICD-10-CM

## 2016-01-13 LAB — GASTROINTESTINAL PANEL BY PCR, STOOL (REPLACES STOOL CULTURE)

## 2016-01-13 LAB — C DIFFICILE QUICK SCREEN W PCR REFLEX
C DIFFICILE (CDIFF) INTERP: NOT DETECTED
C DIFFICILE (CDIFF) TOXIN: NEGATIVE
C DIFFICLE (CDIFF) ANTIGEN: NEGATIVE

## 2016-01-13 MED ORDER — IOPAMIDOL (ISOVUE-300) INJECTION 61%
100.0000 mL | Freq: Once | INTRAVENOUS | Status: AC | PRN
  Administered 2016-01-13: 100 mL via INTRAVENOUS

## 2016-02-20 ENCOUNTER — Ambulatory Visit: Payer: Self-pay | Admitting: Family Medicine

## 2016-05-04 ENCOUNTER — Encounter: Admission: RE | Payer: Self-pay | Source: Ambulatory Visit

## 2016-05-04 ENCOUNTER — Ambulatory Visit
Admission: RE | Admit: 2016-05-04 | Payer: BLUE CROSS/BLUE SHIELD | Source: Ambulatory Visit | Admitting: Gastroenterology

## 2016-05-04 SURGERY — COLONOSCOPY WITH PROPOFOL
Anesthesia: General

## 2016-07-18 ENCOUNTER — Emergency Department
Admission: EM | Admit: 2016-07-18 | Discharge: 2016-07-18 | Disposition: A | Discharge status: Home / Self Care | Payer: BLUE CROSS/BLUE SHIELD | Attending: Emergency Medicine | Admitting: Emergency Medicine

## 2016-07-18 ENCOUNTER — Encounter: Payer: Self-pay | Admitting: *Deleted

## 2016-07-18 DIAGNOSIS — F17210 Nicotine dependence, cigarettes, uncomplicated: Secondary | ICD-10-CM | POA: Insufficient documentation

## 2016-07-18 DIAGNOSIS — K047 Periapical abscess without sinus: Secondary | ICD-10-CM | POA: Insufficient documentation

## 2016-07-18 DIAGNOSIS — Z79899 Other long term (current) drug therapy: Secondary | ICD-10-CM | POA: Insufficient documentation

## 2016-07-18 MED ORDER — LIDOCAINE VISCOUS 2 % MT SOLN
15.0000 mL | Freq: Once | OROMUCOSAL | Status: AC
  Administered 2016-07-18: 15 mL via OROMUCOSAL
  Filled 2016-07-18: qty 15

## 2016-07-18 MED ORDER — AMOXICILLIN 500 MG PO TABS: 500.0000 mg | ORAL_TABLET | Freq: Two times a day (BID) | ORAL | 0 refills | Status: AC

## 2016-07-18 MED ORDER — KETOROLAC TROMETHAMINE 10 MG PO TABS: 10.0000 mg | ORAL_TABLET | Freq: Once | ORAL | Status: DC

## 2016-07-18 MED ORDER — KETOROLAC TROMETHAMINE 10 MG PO TABS
ORAL_TABLET | ORAL | Status: AC
  Filled 2016-07-18: qty 1

## 2016-07-18 MED ORDER — KETOROLAC TROMETHAMINE 10 MG PO TABS: 10.0000 mg | ORAL_TABLET | Freq: Four times a day (QID) | ORAL | 0 refills | Status: DC | PRN

## 2016-07-18 MED ORDER — AMOXICILLIN 500 MG PO CAPS
500.0000 mg | ORAL_CAPSULE | Freq: Once | ORAL | Status: AC
  Administered 2016-07-18: 500 mg via ORAL
  Filled 2016-07-18: qty 1

## 2016-07-18 MED ORDER — KETOROLAC TROMETHAMINE 10 MG PO TABS
10.0000 mg | ORAL_TABLET | Freq: Once | ORAL | Status: AC
  Administered 2016-07-18: 10 mg via ORAL
  Filled 2016-07-18: qty 1

## 2016-07-18 NOTE — ED Triage Notes (Signed)
Pt has left lower toothache.  No otc meds for pain.  Pt alert   Speech clear.

## 2016-07-18 NOTE — ED Provider Notes (Signed)
[facility] Emergency Department Provider Note   First MD Initiated Contact with Patient 07/18/16 0300     (approximate)  I have reviewed the triage vital signs and the nursing notes.   HISTORY  Chief Complaint Dental Pain   HPI Tyler Hughes is a 31 y.o. male resents with dental pain timesone day. Patient states that his left lower wisdom tooth is impacted and these had increasing pain over the course of today. Patient denies any difficulty swallowing no fever.    Past medical history No pertinent past medical history There are no active problems to display for this patient.   Past Surgical History:  Procedure Laterality Date  . CHOLECYSTECTOMY    . FACIAL FRACTURE SURGERY    . FEMUR SURGERY    . OPEN REDUCTION INTERNAL FIXATION (ORIF) DISTAL RADIAL FRACTURE Right 02/24/2015   Procedure: OPEN REDUCTION INTERNAL FIXATION (ORIF) DISTAL RADIAL FRACTURE;  Surgeon: Kennedy BuckerMichael Menz, MD;  Location: [facility] ORS;  Service: Orthopedics;  Laterality: Right;  . TONSILLECTOMY      Prior to Admission medications   Medication Sig Start Date End Date Taking? Authorizing Provider  amoxicillin (AMOXIL) 500 MG tablet Take 1 tablet (500 mg total) by mouth 2 (two) times daily. 07/18/16 07/28/16  Darci Currentandolph N Brown, MD  cyclobenzaprine (FLEXERIL) 10 MG tablet Take 1 tablet (10 mg total) by mouth every 8 (eight) hours as needed for muscle spasms. 12/18/14   Joni Reiningonald K Smith, PA-C  docusate sodium (COLACE) 100 MG capsule Take 1 tablet once or twice daily as needed for constipation while taking narcotic pain medicine Patient not taking: Reported on 02/24/2015 02/19/15   Loleta Roseory Forbach, MD  fexofenadine-pseudoephedrine (ALLEGRA-D) 60-120 MG per tablet Take 1 tablet by mouth 2 (two) times daily. Patient not taking: Reported on 02/24/2015 01/12/15   Joni Reiningonald K Smith, PA-C  HYDROcodone-acetaminophen [facility](NORCO) 5-325 MG tablet Take 1 tablet by mouth every 6 (six) hours as needed for moderate  pain. 02/24/15   Kennedy BuckerMichael Menz, MD  HYDROcodone-acetaminophen (NORCO/VICODIN) 5-325 MG tablet Take 1-2 tablets by mouth every 4 (four) hours as needed for moderate pain. 02/19/15   Loleta Roseory Forbach, MD  ibuprofen (ADVIL,MOTRIN) 800 MG tablet Take 1 tablet (800 mg total) by mouth 3 (three) times daily. Patient not taking: Reported on 02/24/2015 04/27/12   Sunnie NielsenBrian Opitz, MD  ibuprofen (ADVIL,MOTRIN) 800 MG tablet Take 1 tablet (800 mg total) by mouth every 8 (eight) hours as needed for moderate pain. 01/12/15   Joni Reiningonald K Smith, PA-C  ketorolac (TORADOL) 10 MG tablet Take 1 tablet (10 mg total) by mouth every 6 (six) hours as needed. 07/18/16   Darci Currentandolph N Brown, MD  oxyCODONE-acetaminophen (PERCOCET/ROXICET) 5-325 MG per tablet Take 2 tablets by mouth every 4 (four) hours as needed for pain. Patient not taking: Reported on 02/24/2015 04/27/12   Sunnie NielsenBrian Opitz, MD  traMADol (ULTRAM) 50 MG tablet Take 1 tablet (50 mg total) by mouth every 6 (six) hours as needed for moderate pain. Patient not taking: Reported on 02/24/2015 12/18/14   Joni Reiningonald K Smith, PA-C    Allergies Patient has no known allergies.  No family history on file.  Social History Social History  Substance Use Topics  . Smoking status: Current Every Day Smoker    Packs/day: 0.50    Types: Cigarettes  . Smokeless tobacco: Never Used  . Alcohol use Yes     Comment: occ.    Review of Systems Constitutional: No fever/chills Eyes: No visual changes. ENT: No sore throat.Positive  for dental pain Cardiovascular: Denies chest pain. Respiratory: Denies shortness of breath. Gastrointestinal: No abdominal pain.  No nausea, no vomiting.  No diarrhea.  No constipation. Genitourinary: Negative for dysuria. Musculoskeletal: Negative for back pain. Skin: Negative for rash. Neurological: Negative for headaches, focal weakness or numbness.  10-point ROS otherwise negative.  ____________________________________________   PHYSICAL EXAM:  VITAL  SIGNS: ED Triage Vitals  Enc Vitals Group     BP 07/18/16 0254 120/70     Pulse Rate 07/18/16 0254 (!) 58     Resp 07/18/16 0254 18     Temp 07/18/16 0254 98.6 F (37 C)     Temp Source 07/18/16 0254 Oral     SpO2 07/18/16 0254 99 %     Weight 07/18/16 0255 160 lb (72.6 kg)     Height 07/18/16 0255 5\' 7"  (1.702 m)     Head Circumference --      Peak Flow --      Pain Score 07/18/16 0255 6     Pain Loc --      Pain Edu? --      Excl. in GC? --     Constitutional: Alert and oriented. Well appearing and in no acute distress. Eyes: Conjunctivae are normal. PERRL. EOMI. Head: Atraumatic. Mouth/Throat: Mucous membranes are moist.  Oropharynx non-erythematous. Impacted left mandibular wisdom tooth Neck: No stridor.   Skin:  Skin is warm, dry and intact. No rash noted.    Procedures   ____________________________________________   INITIAL IMPRESSION / ASSESSMENT AND PLAN / ED COURSE  Pertinent labs & imaging results that were available during my care of the patient were reviewed by me and considered in my medical decision making (see chart for details).  Patient given amoxicillin Toradol in the emergency department will be prescribed same for home.      ____________________________________________  FINAL CLINICAL IMPRESSION(S) / ED DIAGNOSES  Final diagnoses:  Dental infection     MEDICATIONS GIVEN DURING THIS VISIT:  Medications  lidocaine (XYLOCAINE) 2 % viscous mouth solution 15 mL (15 mLs Mouth/Throat Given 07/18/16 0326)  ketorolac (TORADOL) tablet 10 mg (10 mg Oral Given 07/18/16 0326)  amoxicillin (AMOXIL) capsule 500 mg (500 mg Oral Given 07/18/16 0326)     NEW OUTPATIENT MEDICATIONS STARTED DURING THIS VISIT:  New Prescriptions   AMOXICILLIN (AMOXIL) 500 MG TABLET    Take 1 tablet (500 mg total) by mouth 2 (two) times daily.   KETOROLAC (TORADOL) 10 MG TABLET    Take 1 tablet (10 mg total) by mouth every 6 (six) hours as needed.    Modified  Medications   No medications on file    Discontinued Medications   No medications on file     Note:  This document was prepared using Dragon voice recognition software and may include unintentional dictation errors.    Darci Current, MD 07/18/16 [phone]

## 2016-07-18 NOTE — ED Notes (Signed)
Pt discharged to home.  Discharge instructions reviewed.  Verbalized understanding.  No questions or concerns at this time.  Teach back verified.  Pt in NAD.  No items left in ED.   

## 2018-02-23 ENCOUNTER — Other Ambulatory Visit: Payer: Self-pay

## 2018-02-23 ENCOUNTER — Emergency Department
Admission: EM | Admit: 2018-02-23 | Discharge: 2018-02-23 | Disposition: A | Discharge status: Home / Self Care | Payer: Self-pay | Attending: Emergency Medicine | Admitting: Emergency Medicine

## 2018-02-23 ENCOUNTER — Emergency Department: Payer: Self-pay

## 2018-02-23 DIAGNOSIS — J209 Acute bronchitis, unspecified: Secondary | ICD-10-CM | POA: Insufficient documentation

## 2018-02-23 DIAGNOSIS — F17210 Nicotine dependence, cigarettes, uncomplicated: Secondary | ICD-10-CM | POA: Insufficient documentation

## 2018-02-23 DIAGNOSIS — Z9049 Acquired absence of other specified parts of digestive tract: Secondary | ICD-10-CM | POA: Insufficient documentation

## 2018-02-23 LAB — INFLUENZA PANEL BY PCR (TYPE A & B)
INFLBPCR: NEGATIVE
Influenza A By PCR: NEGATIVE

## 2018-02-23 MED ORDER — SULFAMETHOXAZOLE-TRIMETHOPRIM 800-160 MG PO TABS: 1.0000 | ORAL_TABLET | Freq: Two times a day (BID) | ORAL | 0 refills | Status: DC

## 2018-02-23 MED ORDER — PSEUDOEPH-BROMPHEN-DM 30-2-10 MG/5ML PO SYRP: 5.0000 mL | ORAL_SOLUTION | Freq: Four times a day (QID) | ORAL | 0 refills | Status: DC | PRN

## 2018-02-23 MED ORDER — ACETAMINOPHEN 325 MG PO TABS
650.0000 mg | ORAL_TABLET | Freq: Once | ORAL | Status: AC | PRN
  Administered 2018-02-23: 650 mg via ORAL

## 2018-02-23 MED ORDER — PREDNISONE 10 MG PO TABS: ORAL_TABLET | ORAL | 0 refills | Status: DC

## 2018-02-23 NOTE — ED Provider Notes (Signed)
[facility] Emergency Department Provider Note  ____________________________________________   First MD Initiated Contact with Patient 02/23/18 1213     (approximate)  I have reviewed the triage vital signs and the nursing notes.   HISTORY  Chief Complaint Cough   HPI Tyler Hughes is a 32 y.o. male presents to the ED with complaint of cough and congestion for 2 to 3 days.  Patient states that his cough is very thick.  He is also had some fever.  He states that currently his 46-year-old has been diagnosed with croup.  He is unaware of any exposure to influenza or strep.  Patient is a current cigarette smoker.  He rates his pain as 5/10.   History reviewed. No pertinent past medical history.  There are no active problems to display for this patient.   Past Surgical History:  Procedure Laterality Date  . CHOLECYSTECTOMY    . FACIAL FRACTURE SURGERY    . FEMUR SURGERY    . OPEN REDUCTION INTERNAL FIXATION (ORIF) DISTAL RADIAL FRACTURE Right 02/24/2015   Procedure: OPEN REDUCTION INTERNAL FIXATION (ORIF) DISTAL RADIAL FRACTURE;  Surgeon: Kennedy Bucker, MD;  Location: [facility] ORS;  Service: Orthopedics;  Laterality: Right;  . TONSILLECTOMY      Prior to Admission medications   Medication Sig Start Date End Date Taking? Authorizing Provider  brompheniramine-pseudoephedrine-DM 30-2-10 MG/5ML syrup Take 5 mLs by mouth 4 (four) times daily as needed. 02/23/18   Tommi Rumps, PA-C  predniSONE (DELTASONE) 10 MG tablet Take 3 tablets once a day for the next 5 days 02/23/18   Tommi Rumps, PA-C  sulfamethoxazole-trimethoprim (BACTRIM DS,SEPTRA DS) 800-160 MG tablet Take 1 tablet by mouth 2 (two) times daily. 02/23/18   Tommi Rumps, PA-C    Allergies Patient has no known allergies.  History reviewed. No pertinent family history.  Social History Social History   Tobacco Use  . Smoking status: Current Every Day Smoker    Packs/day: 0.50   Types: Cigarettes  . Smokeless tobacco: Never Used  Substance Use Topics  . Alcohol use: Yes    Comment: occ.  . Drug use: No    Review of Systems Constitutional: Positive fever/positive chills Eyes: No visual changes. ENT: No sore throat.  Positive nasal congestion. Cardiovascular: Denies chest pain. Respiratory: Denies shortness of breath.  Positive productive cough. Gastrointestinal: No abdominal pain.  No nausea, no vomiting.  No diarrhea.   Musculoskeletal: Negative for muscle aches. Skin: Negative for rash. Neurological: Negative for headaches, focal weakness or numbness. ____________________________________________   PHYSICAL EXAM:  VITAL SIGNS: ED Triage Vitals  Enc Vitals Group     BP 02/23/18 1152 (!) 137/93     Pulse Rate 02/23/18 1152 (!) 111     Resp 02/23/18 1152 16     Temp 02/23/18 1152 (!) 101.3 F (38.5 C)     Temp Source 02/23/18 1152 Oral     SpO2 02/23/18 1152 99 %     Weight 02/23/18 1152 155 lb (70.3 kg)     Height 02/23/18 1152 5\' 7"  (1.702 m)     Head Circumference --      Peak Flow --      Pain Score 02/23/18 1200 5     Pain Loc --      Pain Edu? --      Excl. in GC? --    Constitutional: Alert and oriented. Well appearing and in no acute distress. Eyes: Conjunctivae are normal. PERRL. EOMI. Head:  Atraumatic. Nose: Mild congestion/rhinnorhea.  EACs are clear.  TMs are dull with poor light reflex bilaterally but no erythema or injection seen. Mouth/Throat: Mucous membranes are moist.  Oropharynx non-erythematous.  Positive posterior drainage. Neck: No stridor.   Hematological/Lymphatic/Immunilogical: No cervical lymphadenopathy. Cardiovascular: Normal rate, regular rhythm. Grossly normal heart sounds.  Good peripheral circulation. Respiratory: Normal respiratory effort.  No retractions. Lungs no wheezes are noted.  Patient has a very congested cough and sounds bronchitic in nature. Gastrointestinal: Soft and nontender. No distention.    Musculoskeletal: His upper and lower extremities without any difficulty.  Normal gait was noted. Neurologic:  Normal speech and language. No gross focal neurologic deficits are appreciated.  Skin:  Skin is warm, dry and intact. No rash noted. Psychiatric: Mood and affect are normal. Speech and behavior are normal.  ____________________________________________   LABS (all labs ordered are listed, but only abnormal results are displayed)  Labs Reviewed  INFLUENZA PANEL BY PCR (TYPE A & B)   RADIOLOGY  ED MD interpretation:   Chest x-ray is negative for pneumonia.  Official radiology report(s): Dg Chest 2 View  Result Date: 02/23/2018 CLINICAL DATA:  Fever for the past 2-3 days. Head congestion. Smoker. EXAM: CHEST - 2 VIEW COMPARISON:  None. FINDINGS: Normal sized heart. Clear lungs. The lungs are mildly hyperexpanded with mild peribronchial thickening. Unremarkable bones. Cholecystectomy clips. IMPRESSION: No acute abnormality.  Mild changes of COPD and chronic bronchitis. Electronically Signed   By: Beckie Salts M.D.   On: 02/23/2018 13:07    ____________________________________________   PROCEDURES  Procedure(s) performed: None  Procedures  Critical Care performed: No  ____________________________________________   INITIAL IMPRESSION / ASSESSMENT AND PLAN / ED COURSE  As part of my medical decision making, I reviewed the following data within the electronic MEDICAL RECORD NUMBER Notes from prior ED visits and [place] Controlled Substance Database  Patient presents today with complaint of nasal congestion and cough.  Patient is a current everyday smoker.  He is unaware of any exposure to influenza.  Currently has temperature of 101.3.  Patient was given Tylenol in triage.  Influenza test was negative.  Chest x-ray did not show any pneumonia but shows chronic bronchitis however on physical exam patient appears to have both a viral URI and acute bronchitis.  Patient is encouraged to  discontinue smoking.  He was given a prescription for prednisone 30 mg daily for the next 5 days, Bromfed-DM, and Bactrim DS twice daily for 10 days.  He is encouraged to discontinue smoking.  Increase fluids.  Tylenol if needed for fever.  Is to follow-up with his PCP or [facility] acute care if any continued problems.   ____________________________________________   FINAL CLINICAL IMPRESSION(S) / ED DIAGNOSES  Final diagnoses:  Acute bronchitis, unspecified organism  Cigarette smoker     ED Discharge Orders         Ordered    sulfamethoxazole-trimethoprim (BACTRIM DS,SEPTRA DS) 800-160 MG tablet  2 times daily     02/23/18 1324    brompheniramine-pseudoephedrine-DM 30-2-10 MG/5ML syrup  4 times daily PRN     02/23/18 1324    predniSONE (DELTASONE) 10 MG tablet     02/23/18 1324           Note:  This document was prepared using Dragon voice recognition software and may include unintentional dictation errors.    Tommi Rumps, PA-C 02/23/18 1647    Minna Antis, MD 02/24/18 [phone]

## 2018-02-23 NOTE — Discharge Instructions (Signed)
Follow-up with [facility] acute care if any continued problems.  Return to the emergency department if any severe worsening of your breathing or shortness of breath.  Discontinue smoking.  Increase fluids.  Begin taking antibiotics twice a day until completely finished.  Also prednisone is to be taken as directed once a day until finished.  The cough and congestion medication is to be taken 4 times a day.  You may take Tylenol with these medications if needed for fever.

## 2018-02-23 NOTE — ED Triage Notes (Signed)
Pt c/o cough and congestion, mask in place. State "my 32 year old has the croup and I think I got it bu 10x worse." symptoms began Friday.

## 2018-02-23 NOTE — ED Notes (Signed)
Pt brought back to room 52. Pt has flu-like symptoms. Given tylenol in triage for fever.

## 2018-09-06 ENCOUNTER — Emergency Department: Payer: PRIVATE HEALTH INSURANCE

## 2018-09-06 ENCOUNTER — Other Ambulatory Visit: Payer: Self-pay

## 2018-09-06 ENCOUNTER — Emergency Department
Admission: EM | Admit: 2018-09-06 | Discharge: 2018-09-06 | Disposition: A | Discharge status: Home / Self Care | Payer: PRIVATE HEALTH INSURANCE | Attending: Emergency Medicine | Admitting: Emergency Medicine

## 2018-09-06 ENCOUNTER — Encounter: Payer: Self-pay | Admitting: Emergency Medicine

## 2018-09-06 DIAGNOSIS — Y998 Other external cause status: Secondary | ICD-10-CM | POA: Insufficient documentation

## 2018-09-06 DIAGNOSIS — Y93I9 Activity, other involving external motion: Secondary | ICD-10-CM | POA: Diagnosis not present

## 2018-09-06 DIAGNOSIS — S4991XA Unspecified injury of right shoulder and upper arm, initial encounter: Secondary | ICD-10-CM | POA: Diagnosis present

## 2018-09-06 DIAGNOSIS — S43101A Unspecified dislocation of right acromioclavicular joint, initial encounter: Secondary | ICD-10-CM | POA: Diagnosis not present

## 2018-09-06 DIAGNOSIS — Y9289 Other specified places as the place of occurrence of the external cause: Secondary | ICD-10-CM | POA: Insufficient documentation

## 2018-09-06 MED ORDER — OXYCODONE-ACETAMINOPHEN 5-325 MG PO TABS
1.0000 | ORAL_TABLET | Freq: Once | ORAL | Status: AC
  Administered 2018-09-06: 1 via ORAL
  Filled 2018-09-06: qty 1

## 2018-09-06 MED ORDER — OXYCODONE-ACETAMINOPHEN 5-325 MG PO TABS: 1.0000 | ORAL_TABLET | ORAL | 0 refills | Status: DC | PRN

## 2018-09-06 NOTE — Discharge Instructions (Addendum)
Apply ice to the right shoulder.  Take pain medication as needed.  Follow-up with [facility] clinic orthopedics.  Please call on Monday morning and tell them you have a grade 3 right shoulder separation.  Wear the sling until evaluated by orthopedics.  They will instruct you at that time.
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
## 2019-05-13 ENCOUNTER — Other Ambulatory Visit: Payer: Self-pay

## 2019-05-13 ENCOUNTER — Encounter: Payer: Self-pay | Admitting: Emergency Medicine

## 2019-05-13 ENCOUNTER — Emergency Department
Admission: EM | Admit: 2019-05-13 | Discharge: 2019-05-13 | Disposition: A | Discharge status: Home / Self Care | Payer: PRIVATE HEALTH INSURANCE | Attending: Emergency Medicine | Admitting: Emergency Medicine

## 2019-05-13 ENCOUNTER — Emergency Department: Payer: PRIVATE HEALTH INSURANCE

## 2019-05-13 DIAGNOSIS — F17210 Nicotine dependence, cigarettes, uncomplicated: Secondary | ICD-10-CM | POA: Insufficient documentation

## 2019-05-13 DIAGNOSIS — R0789 Other chest pain: Secondary | ICD-10-CM

## 2019-05-13 DIAGNOSIS — Z79899 Other long term (current) drug therapy: Secondary | ICD-10-CM | POA: Insufficient documentation

## 2019-05-13 LAB — BASIC METABOLIC PANEL
Anion gap: 9 (ref 5–15)
BUN: 17 mg/dL (ref 6–20)
CO2: 26 mmol/L (ref 22–32)
Calcium: 9.3 mg/dL (ref 8.9–10.3)
Chloride: 104 mmol/L (ref 98–111)
Creatinine, Ser: 0.92 mg/dL (ref 0.61–1.24)
GFR calc Af Amer: >60 mL/min (ref >60)
GFR calc non Af Amer: >60 mL/min (ref >60)
Glucose, Bld: 96 mg/dL (ref 70–99)
Potassium: 4 mmol/L (ref 3.5–5.1)
Sodium: 139 mmol/L (ref 135–145)

## 2019-05-13 LAB — CBC
HCT: 48.2 % (ref 39.0–52.0)
Hemoglobin: 16.7 g/dL (ref 13.0–17.0)
MCH: 30.6 pg (ref 26.0–34.0)
MCHC: 34.6 g/dL (ref 30.0–36.0)
MCV: 88.4 fL (ref 80.0–100.0)
Platelets: 157 10*3/uL (ref 150–400)
RBC: 5.45 MIL/uL (ref 4.22–5.81)
RDW: 15 % (ref 11.5–15.5)
WBC: 7 10*3/uL (ref 4.0–10.5)
nRBC: 0 % (ref 0.0–0.2)

## 2019-05-13 LAB — TROPONIN I (HIGH SENSITIVITY): Troponin I (High Sensitivity): <2 ng/L (ref <18)

## 2019-05-13 MED ORDER — FAMOTIDINE 20 MG PO TABS: 20.0000 mg | ORAL_TABLET | Freq: Two times a day (BID) | ORAL | 0 refills | Status: DC

## 2019-05-13 MED ORDER — SUCRALFATE 1 G PO TABS: 1.0000 g | ORAL_TABLET | Freq: Four times a day (QID) | ORAL | 1 refills | Status: DC

## 2019-05-13 MED ORDER — VALACYCLOVIR HCL 1 G PO TABS: 1000.0000 mg | ORAL_TABLET | Freq: Three times a day (TID) | ORAL | 0 refills | Status: AC

## 2019-05-13 NOTE — ED Triage Notes (Signed)
Pt reports intermittent funny feeling in his chest for the last couple of days and some SOB.

## 2019-05-13 NOTE — ED Notes (Signed)
Pt reports left sided chest pain for a few days radiating to back and left arm. Denies N/V/ dizziness. Reports SHOB.   Family hx of cardiac issues.  Pt in NAD at this time.

## 2019-05-13 NOTE — ED Provider Notes (Signed)
[facility] Emergency Department Provider Note  ____________________________________________  Time seen: Approximately 1:53 PM  I have reviewed the triage vital signs and the nursing notes.   HISTORY  Chief Complaint Chest Pain and Shortness of Breath    HPI Tyler Hughes is a 34 y.o. male with a history of GERD who comes the ED complaining of left-sided chest pain that is anterior and wraps around to the back for the past 2 days.  Intermittent lasting a few seconds at a time, feels like a burning electric pain.  When it occurs, it hurts so much that it makes him feel like he cannot breathe, but he denies shortness of breath.  No nausea or vomiting, no belly pain, no exertional or pleuritic symptoms.  No fevers chills body aches or constitutional symptoms.  He denies any Covid symptoms.  No recent trauma.  He notes that he has had symptoms like this over the past 4 years but it is just worse again right now.  He used to take Nexium which alleviated the symptoms very well but he has not taken it recently.  Symptoms are worse lying down and with manual palpation of the area.     History reviewed. No pertinent past medical history.   There are no problems to display for this patient.    Past Surgical History:  Procedure Laterality Date  . CHOLECYSTECTOMY    . FACIAL FRACTURE SURGERY    . FEMUR SURGERY    . OPEN REDUCTION INTERNAL FIXATION (ORIF) DISTAL RADIAL FRACTURE Right 02/24/2015   Procedure: OPEN REDUCTION INTERNAL FIXATION (ORIF) DISTAL RADIAL FRACTURE;  Surgeon: Kennedy Bucker, MD;  Location: [facility] ORS;  Service: Orthopedics;  Laterality: Right;  . TONSILLECTOMY       Prior to Admission medications   Medication Sig Start Date End Date Taking? Authorizing Provider  brompheniramine-pseudoephedrine-DM 30-2-10 MG/5ML syrup Take 5 mLs by mouth 4 (four) times daily as needed. 02/23/18   Tommi Rumps, PA-C  famotidine (PEPCID) 20 MG tablet Take 1  tablet (20 mg total) by mouth 2 (two) times daily. 05/13/19   Sharman Cheek, MD  oxyCODONE-acetaminophen (PERCOCET/ROXICET) 5-325 MG tablet Take 1-2 tablets by mouth every 4 (four) hours as needed for severe pain. 09/06/18   Fisher, Roselyn Bering, PA-C  predniSONE (DELTASONE) 10 MG tablet Take 3 tablets once a day for the next 5 days 02/23/18   Tommi Rumps, PA-C  sucralfate (CARAFATE) 1 g tablet Take 1 tablet (1 g total) by mouth 4 (four) times daily. 05/13/19   Sharman Cheek, MD  sulfamethoxazole-trimethoprim (BACTRIM DS,SEPTRA DS) 800-160 MG tablet Take 1 tablet by mouth 2 (two) times daily. 02/23/18   Tommi Rumps, PA-C  valACYclovir (VALTREX) 1000 MG tablet Take 1 tablet (1,000 mg total) by mouth 3 (three) times daily for 7 days. 05/13/19 05/20/19  Sharman Cheek, MD     Allergies Patient has no known allergies.   No family history on file.  Social History Social History   Tobacco Use  . Smoking status: Current Every Day Smoker    Packs/day: 0.50    Types: Cigarettes  . Smokeless tobacco: Never Used  Substance Use Topics  . Alcohol use: Yes    Comment: occ.  . Drug use: No    Review of Systems  Constitutional:   No fever or chills.  ENT:   No sore throat. No rhinorrhea. Cardiovascular: Positive as above chest pain without syncope. Respiratory:   No dyspnea or cough. Gastrointestinal:  Negative for abdominal pain, vomiting and diarrhea.  Musculoskeletal:   Negative for focal pain or swelling All other systems reviewed and are negative except as documented above in ROS and HPI.  ____________________________________________   PHYSICAL EXAM:  VITAL SIGNS: ED Triage Vitals  Enc Vitals Group     BP 05/13/19 1210 (!) 143/92     Pulse Rate 05/13/19 1210 70     Resp 05/13/19 1210 20     Temp 05/13/19 1210 98 F (36.7 C)     Temp Source 05/13/19 1210 Oral     SpO2 05/13/19 1210 99 %     Weight 05/13/19 1051 155 lb (70.3 kg)     Height 05/13/19 1051 5\' 7"   (1.702 m)     Head Circumference --      Peak Flow --      Pain Score 05/13/19 1051 0     Pain Loc --      Pain Edu? --      Excl. in GC? --     Vital signs reviewed, nursing assessments reviewed.   Constitutional:   Alert and oriented. Non-toxic appearance. Eyes:   Conjunctivae are normal. EOMI. PERRL. ENT      Head:   Normocephalic and atraumatic.      Nose:   Wearing a mask.      Mouth/Throat:   Wearing a mask.      Neck:   No meningismus. Full ROM. Hematological/Lymphatic/Immunilogical:   No cervical lymphadenopathy. Cardiovascular:   RRR. Symmetric bilateral radial and DP pulses.  No murmurs. Cap refill less than 2 seconds. Respiratory:   Normal respiratory effort without tachypnea/retractions. Breath sounds are clear and equal bilaterally. No wheezes/rales/rhonchi. Musculoskeletal:   Normal range of motion in all extremities.  There is tenderness over the left chest wall reproducing pain Neurologic:   Normal speech and language.  Motor grossly intact. No acute focal neurologic deficits are appreciated.  Skin:    Skin is warm, dry and intact. No rash noted.  No petechiae, purpura, or bullae.  ____________________________________________    LABS (pertinent positives/negatives) (all labs ordered are listed, but only abnormal results are displayed) Labs Reviewed  BASIC METABOLIC PANEL  CBC  TROPONIN I (HIGH SENSITIVITY)   ____________________________________________   EKG  Interpreted by me Normal sinus rhythm rate of 69, suspect arm lead reversal, likely normal axis.  Normal intervals normal QRS normal ST segments.  Isolated T wave inversion in lead III which is nonspecific.  ____________________________________________    RADIOLOGY  DG Chest 2 View  Result Date: 05/13/2019 CLINICAL DATA:  Intermittent funny feeling in chest for the last couple days, some shortness of breath, warm down EXAM: CHEST - 2 VIEW COMPARISON:  02/23/2018 FINDINGS: Normal heart size,  mediastinal contours, and pulmonary vascularity. Mild peribronchial thickening. No pulmonary infiltrate, pleural effusion, or pneumothorax. Bones unremarkable. IMPRESSION: Mild chronic bronchitic changes without infiltrate. Electronically Signed   By: 02/25/2018 M.D.   On: 05/13/2019 12:12    ____________________________________________   PROCEDURES Procedures  ____________________________________________  DIFFERENTIAL DIAGNOSIS   GERD, shingles, chest wall pain  CLINICAL IMPRESSION / ASSESSMENT AND PLAN / ED COURSE  Medications ordered in the ED: Medications - No data to display  Pertinent labs & imaging results that were available during my care of the patient were reviewed by me and considered in my medical decision making (see chart for details).  Tyler Hughes was evaluated in Emergency Department on 05/13/2019 for the symptoms described in the history of  present illness. He was evaluated in the context of the global COVID-19 pandemic, which necessitated consideration that the patient might be at risk for infection with the SARS-CoV-2 virus that causes COVID-19. Institutional protocols and algorithms that pertain to the evaluation of patients at risk for COVID-19 are in a state of rapid change based on information released by regulatory bodies including the CDC and federal and state organizations. These policies and algorithms were followed during the patient's care in the ED.   Patient presents with atypical chest pain, most consistent with his history of GERD.Considering the patient's symptoms, medical history, and physical examination today, I have low suspicion for ACS, PE, TAD, pneumothorax, carditis, mediastinitis, pneumonia, CHF, or sepsis.  Work-up is reassuring, chest x-ray and labs are normal, EKG nonischemic.  Trial of antacids, follow-up with primary care.  Low risk for PE, PERC negative.      ____________________________________________   FINAL CLINICAL  IMPRESSION(S) / ED DIAGNOSES    Final diagnoses:  Atypical chest pain     ED Discharge Orders         Ordered    valACYclovir (VALTREX) 1000 MG tablet  3 times daily     05/13/19 1346    famotidine (PEPCID) 20 MG tablet  2 times daily     05/13/19 1348    sucralfate (CARAFATE) 1 g tablet  4 times daily     05/13/19 1348          Portions of this note were generated with dragon dictation software. Dictation errors may occur despite best attempts at proofreading.   Carrie Mew, MD 05/13/19 1404

## 2019-05-13 NOTE — Discharge Instructions (Signed)
Fill the prescription for Valacyclovir and take as directed only if you developed redness or rash on your chest in the area of pain over the next few days.  If this does not occur, you can discard this prescription.   In the meantime, start taking pepcid and carafate to alleviate your symptoms.  You can follow up with the [facility] to ensure your symptoms are improving and have your blood pressure rechecked.

## 2019-12-05 ENCOUNTER — Other Ambulatory Visit: Payer: Self-pay

## 2019-12-05 ENCOUNTER — Encounter: Payer: Self-pay | Admitting: Emergency Medicine

## 2019-12-05 ENCOUNTER — Emergency Department: Payer: PRIVATE HEALTH INSURANCE

## 2019-12-05 ENCOUNTER — Emergency Department
Admission: EM | Admit: 2019-12-05 | Discharge: 2019-12-05 | Disposition: A | Discharge status: Home / Self Care | Payer: PRIVATE HEALTH INSURANCE | Attending: Emergency Medicine | Admitting: Emergency Medicine

## 2019-12-05 DIAGNOSIS — F17210 Nicotine dependence, cigarettes, uncomplicated: Secondary | ICD-10-CM | POA: Insufficient documentation

## 2019-12-05 DIAGNOSIS — R110 Nausea: Secondary | ICD-10-CM | POA: Insufficient documentation

## 2019-12-05 DIAGNOSIS — N200 Calculus of kidney: Secondary | ICD-10-CM | POA: Diagnosis not present

## 2019-12-05 DIAGNOSIS — R1031 Right lower quadrant pain: Secondary | ICD-10-CM | POA: Diagnosis present

## 2019-12-05 LAB — COMPREHENSIVE METABOLIC PANEL
ALT: 29 U/L (ref 0–44)
AST: 24 U/L (ref 15–41)
Albumin: 4.8 g/dL (ref 3.5–5.0)
Alkaline Phosphatase: 83 U/L (ref 38–126)
Anion gap: 11 (ref 5–15)
BUN: 16 mg/dL (ref 6–20)
CO2: 24 mmol/L (ref 22–32)
Calcium: 9.5 mg/dL (ref 8.9–10.3)
Chloride: 105 mmol/L (ref 98–111)
Creatinine, Ser: 1.37 mg/dL — ABNORMAL HIGH (ref 0.61–1.24)
GFR calc Af Amer: >60 mL/min (ref >60)
GFR calc non Af Amer: >60 mL/min (ref >60)
Glucose, Bld: 108 mg/dL — ABNORMAL HIGH (ref 70–99)
Potassium: 4.3 mmol/L (ref 3.5–5.1)
Sodium: 140 mmol/L (ref 135–145)
Total Bilirubin: 1.8 mg/dL — ABNORMAL HIGH (ref 0.3–1.2)
Total Protein: 7.6 g/dL (ref 6.5–8.1)

## 2019-12-05 LAB — URINALYSIS, COMPLETE (UACMP) WITH MICROSCOPIC
Bilirubin Urine: NEGATIVE
Glucose, UA: NEGATIVE mg/dL
Ketones, ur: NEGATIVE mg/dL
Leukocytes,Ua: NEGATIVE
Nitrite: NEGATIVE
Protein, ur: NEGATIVE mg/dL
Specific Gravity, Urine: 1.017 (ref 1.005–1.030)
Squamous Epithelial / LPF: NONE SEEN (ref 0–5)
pH: 7 (ref 5.0–8.0)

## 2019-12-05 LAB — LIPASE, BLOOD: Lipase: 28 U/L (ref 11–51)

## 2019-12-05 LAB — CBC
HCT: 45.4 % (ref 39.0–52.0)
Hemoglobin: 16.2 g/dL (ref 13.0–17.0)
MCH: 31 pg (ref 26.0–34.0)
MCHC: 35.7 g/dL (ref 30.0–36.0)
MCV: 87 fL (ref 80.0–100.0)
Platelets: 190 10*3/uL (ref 150–400)
RBC: 5.22 MIL/uL (ref 4.22–5.81)
RDW: 14 % (ref 11.5–15.5)
WBC: 19.6 10*3/uL — ABNORMAL HIGH (ref 4.0–10.5)
nRBC: 0 % (ref 0.0–0.2)

## 2019-12-05 MED ORDER — IOHEXOL 300 MG/ML  SOLN
100.0000 mL | Freq: Once | INTRAMUSCULAR | Status: AC | PRN
  Administered 2019-12-05: 100 mL via INTRAVENOUS
  Filled 2019-12-05: qty 100

## 2019-12-05 MED ORDER — KETOROLAC TROMETHAMINE 10 MG PO TABS: 10.0000 mg | ORAL_TABLET | Freq: Three times a day (TID) | ORAL | 0 refills | Status: DC

## 2019-12-05 MED ORDER — MORPHINE SULFATE (PF) 2 MG/ML IV SOLN
2.0000 mg | Freq: Once | INTRAVENOUS | Status: AC
  Administered 2019-12-05: 2 mg via INTRAVENOUS
  Filled 2019-12-05: qty 1

## 2019-12-05 MED ORDER — SODIUM CHLORIDE 0.9% FLUSH: 3.0000 mL | Freq: Once | INTRAVENOUS | Status: DC

## 2019-12-05 MED ORDER — SODIUM CHLORIDE 0.9 % IV BOLUS
1000.0000 mL | Freq: Once | INTRAVENOUS | Status: AC
  Administered 2019-12-05: 1000 mL via INTRAVENOUS

## 2019-12-05 MED ORDER — TAMSULOSIN HCL 0.4 MG PO CAPS: 0.4000 mg | ORAL_CAPSULE | Freq: Every day | ORAL | 0 refills | Status: AC

## 2019-12-05 MED ORDER — KETOROLAC TROMETHAMINE 30 MG/ML IJ SOLN
30.0000 mg | Freq: Once | INTRAMUSCULAR | Status: AC
  Administered 2019-12-05: 30 mg via INTRAVENOUS
  Filled 2019-12-05: qty 1

## 2019-12-05 MED ORDER — ONDANSETRON HCL 4 MG/2ML IJ SOLN
4.0000 mg | Freq: Once | INTRAMUSCULAR | Status: AC
  Administered 2019-12-05: 4 mg via INTRAVENOUS
  Filled 2019-12-05: qty 2

## 2019-12-05 MED ORDER — ONDANSETRON 4 MG PO TBDP: 4.0000 mg | ORAL_TABLET | Freq: Three times a day (TID) | ORAL | 0 refills | Status: DC | PRN

## 2019-12-05 MED ORDER — TAMSULOSIN HCL 0.4 MG PO CAPS
0.4000 mg | ORAL_CAPSULE | Freq: Once | ORAL | Status: AC
  Administered 2019-12-05: 0.4 mg via ORAL
  Filled 2019-12-05: qty 1

## 2019-12-05 MED ORDER — OXYCODONE-ACETAMINOPHEN 5-325 MG PO TABS
1.0000 | ORAL_TABLET | ORAL | Status: DC | PRN
  Administered 2019-12-05: 1 via ORAL
  Filled 2019-12-05: qty 1

## 2019-12-05 NOTE — ED Provider Notes (Signed)
[facility] Emergency Department Provider Note ____________________________________________  Time seen: 1201  I have reviewed the triage vital signs and the nursing notes.  HISTORY  Chief Complaint  Abdominal Pain  HPI Tyler Hughes is a 34 y.o. male presents to the ED for evaluation of right lower quadrant abdominal pain.  Patient describes the pain started about 2 days ago and has been intermittent, but progressively worsened since onset.  He reports last week he had a single episode of gross hematuria without urinary retention or flank/abdominal/pelvic pain.  Those symptoms resolved without intervention. Patient denies any interim fevers, chills, or sweats.  He also denies any history of kidney stones.  He is status post a cholecystectomy, tonsillectomy, and ORIF of his femur and right radial bone.  History reviewed. No pertinent past medical history.  There are no problems to display for this patient.   Past Surgical History:  Procedure Laterality Date  . CHOLECYSTECTOMY    . FACIAL FRACTURE SURGERY    . FEMUR SURGERY    . OPEN REDUCTION INTERNAL FIXATION (ORIF) DISTAL RADIAL FRACTURE Right 02/24/2015   Procedure: OPEN REDUCTION INTERNAL FIXATION (ORIF) DISTAL RADIAL FRACTURE;  Surgeon: Kennedy Bucker, MD;  Location: [facility] ORS;  Service: Orthopedics;  Laterality: Right;  . TONSILLECTOMY      Prior to Admission medications   Medication Sig Start Date End Date Taking? Authorizing Provider  famotidine (PEPCID) 20 MG tablet Take 1 tablet (20 mg total) by mouth 2 (two) times daily. 05/13/19   Sharman Cheek, MD  ketorolac (TORADOL) 10 MG tablet Take 1 tablet (10 mg total) by mouth every 8 (eight) hours. 12/05/19   Daziah Hesler, Charlesetta Ivory, PA-C  ondansetron (ZOFRAN ODT) 4 MG disintegrating tablet Take 1 tablet (4 mg total) by mouth every 8 (eight) hours as needed. 12/05/19   Inis Borneman, Charlesetta Ivory, PA-C  tamsulosin (FLOMAX) 0.4 MG CAPS capsule Take 1 capsule  (0.4 mg total) by mouth daily for 14 days. 12/05/19 12/19/19  York Valliant, Charlesetta Ivory, PA-C  sucralfate (CARAFATE) 1 g tablet Take 1 tablet (1 g total) by mouth 4 (four) times daily. 05/13/19 12/05/19  Sharman Cheek, MD    Allergies Patient has no known allergies.  History reviewed. No pertinent family history.  Social History Social History   Tobacco Use  . Smoking status: Current Every Day Smoker    Packs/day: 0.50    Types: Cigarettes  . Smokeless tobacco: Never Used  Substance Use Topics  . Alcohol use: Yes    Comment: occ.  . Drug use: No    Review of Systems  Constitutional: Negative for fever. Cardiovascular: Negative for chest pain. Respiratory: Negative for shortness of breath. Gastrointestinal: Positive for abdominal pain and nausea. Denies vomiting and diarrhea. Genitourinary: Negative for dysuria, retention. Musculoskeletal: Negative for back pain. Skin: Negative for rash. Neurological: Negative for headaches, focal weakness or numbness. ____________________________________________  PHYSICAL EXAM:  VITAL SIGNS: ED Triage Vitals  Enc Vitals Group     BP 12/05/19 1056 (!) 151/101     Pulse Rate 12/05/19 1056 60     Resp 12/05/19 1056 20     Temp 12/05/19 1056 98.2 F (36.8 C)     Temp Source 12/05/19 1056 Oral     SpO2 12/05/19 1056 97 %     Weight 12/05/19 1057 160 lb (72.6 kg)     Height 12/05/19 1057 5\' 6"  (1.676 m)     Head Circumference --      Peak Flow --  Pain Score 12/05/19 1056 10     Pain Loc --      Pain Edu? --      Excl. in GC? --     Constitutional: Alert and oriented. Well appearing and in no distress. Head: Normocephalic and atraumatic. Eyes: Conjunctivae are normal. Normal extraocular movements Cardiovascular: Normal rate, regular rhythm. Normal distal pulses. Respiratory: Normal respiratory effort. No wheezes/rales/rhonchi. Gastrointestinal: Soft and nontender. No distention, rebound, guarding, or rigidity. Mild right CVA  tenderness elicited. Musculoskeletal: Nontender with normal range of motion in all extremities.  Neurologic:  Normal gait without ataxia. Normal speech and language. No gross focal neurologic deficits are appreciated. Skin:  Skin is warm, dry and intact. No rash noted. Psychiatric: Mood and affect are normal. Patient exhibits appropriate insight and judgment. ____________________________________________   LABS (pertinent positives/negatives) Labs Reviewed  CBC - Abnormal; Notable for the following components:      Result Value   WBC 19.6 (*)    All other components within normal limits  URINALYSIS, COMPLETE (UACMP) WITH MICROSCOPIC - Abnormal; Notable for the following components:   Color, Urine YELLOW (*)    APPearance HAZY (*)    Hgb urine dipstick SMALL (*)    Bacteria, UA RARE (*)    All other components within normal limits  COMPREHENSIVE METABOLIC PANEL - Abnormal; Notable for the following components:   Glucose, Bld 108 (*)    Creatinine, Ser 1.37 (*)    Total Bilirubin 1.8 (*)    All other components within normal limits  LIPASE, BLOOD  ____________________________________________   RADIOLOGY  CT ABD/Pelvis w/ CM  IMPRESSION: Moderate right-sided hydronephrosis and perinephric stranding secondary to a 7 mm calculus at the ureteropelvic junction. ____________________________________________  PROCEDURES  Oxycodone-ACEP 5-325 mg PO Zofran 4 mg IVP Morphine 2 mg IVP x 2 NS 1000 ml bolus tamsulosin 0.4 mg PO Toradol 30 mg IVP  Procedures ____________________________________________  INITIAL IMPRESSION / ASSESSMENT AND PLAN / ED COURSE  ----------------------------------------- 3:14 PM on 12/05/2019 ----------------------------------------- S/w Tyler Hughes: He asks for the patient to contact the Tyler Hughes for follow-up next week. Patient is stable for outpatient management.   Patient with ED evaluation of sudden onset of RLQ abdominopelvic pain. He was  found to have a WBC of 19.6 and no evidence of hematuria. He will be discharged with prescriptions for toradol, zofran, and tamsulosin. Return precautions have been reviewed.   Tyler Hughes was evaluated in Emergency Department on 12/05/2019 for the symptoms described in the history of present illness. He was evaluated in the context of the global COVID-19 pandemic, which necessitated consideration that the patient might be at risk for infection with the SARS-CoV-2 virus that causes COVID-19. Institutional protocols and algorithms that pertain to the evaluation of patients at risk for COVID-19 are in a state of rapid change based on information released by regulatory bodies including the CDC and federal and state organizations. These policies and algorithms were followed during the patient's care in the ED. ____________________________________________  FINAL CLINICAL IMPRESSION(S) / ED DIAGNOSES  Final diagnoses:  Right nephrolithiasis      Karmen Stabs, Charlesetta Ivory, PA-C 12/05/19 1738    Minna Antis, MD 12/06/19 1138

## 2019-12-05 NOTE — ED Triage Notes (Signed)
Pt presents to ED via POV with c/o RLQ abdominal pain. Pt states pain started 2 days ago and has progressively worsening over the last 2 days. Pt states last week had 1 episode of hematuria.   Pt's skin noted to be warm, dry, and intact in triage. Pt appears uncomfortable. States, "it feels like something is about to pop".

## 2019-12-05 NOTE — Discharge Instructions (Addendum)
You are being treated for a 7 mm kidney stone on the right.  You take the prescription medications as prescribed.  Continue to hydrate into the bladder regularly.  You should strain your urine as needed to confirm passage of the stone.  Contact [facility] on Monday, to schedule a follow-up appointment.  Return to the ED if needed.

## 2020-07-21 ENCOUNTER — Other Ambulatory Visit: Payer: Self-pay

## 2020-07-21 ENCOUNTER — Emergency Department: Payer: Self-pay

## 2020-07-21 ENCOUNTER — Other Ambulatory Visit: Payer: Self-pay | Admitting: Urology

## 2020-07-21 ENCOUNTER — Emergency Department
Admission: EM | Admit: 2020-07-21 | Discharge: 2020-07-21 | Disposition: A | Discharge status: Home / Self Care | Payer: Self-pay | Attending: Emergency Medicine | Admitting: Emergency Medicine

## 2020-07-21 ENCOUNTER — Encounter: Payer: Self-pay | Admitting: Emergency Medicine

## 2020-07-21 DIAGNOSIS — Z20822 Contact with and (suspected) exposure to covid-19: Secondary | ICD-10-CM | POA: Insufficient documentation

## 2020-07-21 DIAGNOSIS — N201 Calculus of ureter: Secondary | ICD-10-CM

## 2020-07-21 DIAGNOSIS — F17210 Nicotine dependence, cigarettes, uncomplicated: Secondary | ICD-10-CM | POA: Insufficient documentation

## 2020-07-21 LAB — CBC WITH DIFFERENTIAL/PLATELET
Abs Immature Granulocytes: 0.05 10*3/uL (ref 0.00–0.07)
Basophils Absolute: 0 10*3/uL (ref 0.0–0.1)
Basophils Relative: 0 %
Eosinophils Absolute: 0.3 10*3/uL (ref 0.0–0.5)
Eosinophils Relative: 3 %
HCT: 40 % (ref 39.0–52.0)
Hemoglobin: 14.2 g/dL (ref 13.0–17.0)
Immature Granulocytes: 0 %
Lymphocytes Relative: 10 %
Lymphs Abs: 1.2 10*3/uL (ref 0.7–4.0)
MCH: 30.6 pg (ref 26.0–34.0)
MCHC: 35.5 g/dL (ref 30.0–36.0)
MCV: 86.2 fL (ref 80.0–100.0)
Monocytes Absolute: 1 10*3/uL (ref 0.1–1.0)
Monocytes Relative: 8 %
Neutro Abs: 9.2 10*3/uL — ABNORMAL HIGH (ref 1.7–7.7)
Neutrophils Relative %: 79 %
Platelets: 202 10*3/uL (ref 150–400)
RBC: 4.64 MIL/uL (ref 4.22–5.81)
RDW: 14.2 % (ref 11.5–15.5)
WBC: 11.7 10*3/uL — ABNORMAL HIGH (ref 4.0–10.5)
nRBC: 0 % (ref 0.0–0.2)

## 2020-07-21 LAB — COMPREHENSIVE METABOLIC PANEL
ALT: 58 U/L — ABNORMAL HIGH (ref 0–44)
AST: 84 U/L — ABNORMAL HIGH (ref 15–41)
Albumin: 4.2 g/dL (ref 3.5–5.0)
Alkaline Phosphatase: 80 U/L (ref 38–126)
Anion gap: 8 (ref 5–15)
BUN: 21 mg/dL — ABNORMAL HIGH (ref 6–20)
CO2: 25 mmol/L (ref 22–32)
Calcium: 8.5 mg/dL — ABNORMAL LOW (ref 8.9–10.3)
Chloride: 104 mmol/L (ref 98–111)
Creatinine, Ser: 1.35 mg/dL — ABNORMAL HIGH (ref 0.61–1.24)
GFR, Estimated: >60 mL/min (ref >60)
Glucose, Bld: 106 mg/dL — ABNORMAL HIGH (ref 70–99)
Potassium: 3.9 mmol/L (ref 3.5–5.1)
Sodium: 137 mmol/L (ref 135–145)
Total Bilirubin: 1.1 mg/dL (ref 0.3–1.2)
Total Protein: 6.8 g/dL (ref 6.5–8.1)

## 2020-07-21 LAB — URINALYSIS, COMPLETE (UACMP) WITH MICROSCOPIC
Bacteria, UA: NONE SEEN
Bilirubin Urine: NEGATIVE
Glucose, UA: NEGATIVE mg/dL
Hgb urine dipstick: NEGATIVE
Ketones, ur: NEGATIVE mg/dL
Leukocytes,Ua: NEGATIVE
Nitrite: NEGATIVE
Protein, ur: NEGATIVE mg/dL
Specific Gravity, Urine: 1.025 (ref 1.005–1.030)
pH: 6 (ref 5.0–8.0)

## 2020-07-21 LAB — RESP PANEL BY RT-PCR (FLU A&B, COVID) ARPGX2
Influenza A by PCR: NEGATIVE
Influenza B by PCR: NEGATIVE
SARS Coronavirus 2 by RT PCR: NEGATIVE

## 2020-07-21 MED ORDER — MORPHINE SULFATE (PF) 4 MG/ML IV SOLN
4.0000 mg | Freq: Once | INTRAVENOUS | Status: AC
  Administered 2020-07-21: 4 mg via INTRAVENOUS
  Filled 2020-07-21: qty 1

## 2020-07-21 MED ORDER — OXYCODONE-ACETAMINOPHEN 5-325 MG PO TABS: 1.0000 | ORAL_TABLET | Freq: Four times a day (QID) | ORAL | 0 refills | Status: AC | PRN

## 2020-07-21 MED ORDER — ONDANSETRON HCL 4 MG/2ML IJ SOLN
4.0000 mg | Freq: Once | INTRAMUSCULAR | Status: AC
  Administered 2020-07-21: 4 mg via INTRAVENOUS
  Filled 2020-07-21: qty 2

## 2020-07-21 MED ORDER — LACTATED RINGERS IV BOLUS
1000.0000 mL | Freq: Once | INTRAVENOUS | Status: AC
  Administered 2020-07-21: 1000 mL via INTRAVENOUS

## 2020-07-21 MED ORDER — ONDANSETRON 4 MG PO TBDP
4.0000 mg | ORAL_TABLET | Freq: Three times a day (TID) | ORAL | 0 refills | Status: DC | PRN
Start: 2020-07-21 — End: 2021-10-07

## 2020-07-21 NOTE — ED Provider Notes (Signed)
[facility] Emergency Department Provider Note  ____________________________________________  Time seen: Approximately 10:14 AM  I have reviewed the triage vital signs and the nursing notes.   HISTORY  Chief Complaint Abdominal Pain    HPI Tyler Hughes is a 35 y.o. male with a known 7 mm right sided kidney stone who comes ED complaining of worsening right flank pain radiating to the groin.  Pain has been present for 3 weeks, waxing and waning, no aggravating or alleviating factors, associated with nausea.  No fever.  No weakness syncope or palpitations.  Pain has been worsening in the last 2 days, now severe and more constant despite taking Toradol and Flomax at the been prescribed to him. EMR reviewed, showing CT at [facility] on July 08, 2020 that diagnosed a 7 mm distal right stone near the UVJ with mild hydronephrosis.  Urinalysis at that time negative for signs of infection, creatinine 1.1.       History reviewed. No pertinent past medical history.   There are no problems to display for this patient.    Past Surgical History:  Procedure Laterality Date  . CHOLECYSTECTOMY    . FACIAL FRACTURE SURGERY    . FEMUR SURGERY    . OPEN REDUCTION INTERNAL FIXATION (ORIF) DISTAL RADIAL FRACTURE Right 02/24/2015   Procedure: OPEN REDUCTION INTERNAL FIXATION (ORIF) DISTAL RADIAL FRACTURE;  Surgeon: Kennedy Bucker, MD;  Location: [facility] ORS;  Service: Orthopedics;  Laterality: Right;  . TONSILLECTOMY       Prior to Admission medications   Medication Sig Start Date End Date Taking? Authorizing Provider  ondansetron (ZOFRAN ODT) 4 MG disintegrating tablet Take 1 tablet (4 mg total) by mouth every 8 (eight) hours as needed for nausea or vomiting. 07/21/20  Yes Sharman Cheek, MD  oxyCODONE-acetaminophen (PERCOCET) 5-325 MG tablet Take 1 tablet by mouth every 6 (six) hours as needed for up to 2 days for severe pain. 07/21/20 07/23/20 Yes Sharman Cheek, MD   famotidine (PEPCID) 20 MG tablet Take 1 tablet (20 mg total) by mouth 2 (two) times daily. 05/13/19   Sharman Cheek, MD  ketorolac (TORADOL) 10 MG tablet Take 1 tablet (10 mg total) by mouth every 8 (eight) hours. 12/05/19   Menshew, Charlesetta Ivory, PA-C  oxyCODONE (OXY IR/ROXICODONE) 5 MG immediate release tablet Take 5 mg by mouth every 4 (four) hours as needed for pain. 07/13/20   [provider]  tamsulosin (FLOMAX) 0.4 MG CAPS capsule Take 0.4 mg by mouth daily. 07/08/20   [provider]  sucralfate (CARAFATE) 1 g tablet Take 1 tablet (1 g total) by mouth 4 (four) times daily. 05/13/19 12/05/19  Sharman Cheek, MD     Allergies Patient has no known allergies.   History reviewed. No pertinent family history.  Social History Social History   Tobacco Use  . Smoking status: Current Every Day Smoker    Packs/day: 0.50    Types: Cigarettes  . Smokeless tobacco: Never Used  Substance Use Topics  . Alcohol use: Yes    Comment: occ.  . Drug use: No    Review of Systems  Constitutional:   No fever or chills.  ENT:   No sore throat. No rhinorrhea. Cardiovascular:   No chest pain or syncope. Respiratory:   No dyspnea or cough. Gastrointestinal:   Positive flank pain as above.  No vomiting or diarrhea Musculoskeletal:   Negative for focal pain or swelling All other systems reviewed and are negative except as documented above  in ROS and HPI.  ____________________________________________   PHYSICAL EXAM:  VITAL SIGNS: ED Triage Vitals  Enc Vitals Group     BP 07/21/20 0745 (!) 149/95     Pulse Rate 07/21/20 0745 72     Resp 07/21/20 0745 18     Temp 07/21/20 0745 97.8 F (36.6 C)     Temp Source 07/21/20 0745 Oral     SpO2 07/21/20 0745 100 %     Weight 07/21/20 0744 160 lb (72.6 kg)     Height 07/21/20 0744 5\' 7"  (1.702 m)     Head Circumference --      Peak Flow --      Pain Score 07/21/20 0752 8     Pain Loc --      Pain Edu? --      Excl. in  GC? --     Vital signs reviewed, nursing assessments reviewed.   Constitutional:   Alert and oriented. Non-toxic appearance. Eyes:   Conjunctivae are normal. EOMI. PERRL. ENT      Head:   Normocephalic and atraumatic.      Nose:   Wearing a mask.      Mouth/Throat:   Wearing a mask.      Neck:   No meningismus. Full ROM. Hematological/Lymphatic/Immunilogical:   No cervical lymphadenopathy. Cardiovascular:   RRR. Symmetric bilateral radial and DP pulses.  No murmurs. Cap refill less than 2 seconds. Respiratory:   Normal respiratory effort without tachypnea/retractions. Breath sounds are clear and equal bilaterally. No wheezes/rales/rhonchi. Gastrointestinal:   Soft with diffuse right-sided abdominal tenderness, not focal.  No tenderness at McBurney's point.. Non distended. There is no CVA tenderness.  No rebound, rigidity, or guarding.  Musculoskeletal:   Normal range of motion in all extremities. No joint effusions.  No lower extremity tenderness.  No edema. Neurologic:   Normal speech and language.  Motor grossly intact. No acute focal neurologic deficits are appreciated.  Skin:    Skin is warm, dry and intact. No rash noted.  No petechiae, purpura, or bullae.  ____________________________________________    LABS (pertinent positives/negatives) (all labs ordered are listed, but only abnormal results are displayed) Labs Reviewed  URINALYSIS, COMPLETE (UACMP) WITH MICROSCOPIC - Abnormal; Notable for the following components:      Result Value   Color, Urine YELLOW (*)    APPearance CLEAR (*)    All other components within normal limits  COMPREHENSIVE METABOLIC PANEL - Abnormal; Notable for the following components:   Glucose, Bld 106 (*)    BUN 21 (*)    Creatinine, Ser 1.35 (*)    Calcium 8.5 (*)    AST 84 (*)    ALT 58 (*)    All other components within normal limits  CBC WITH DIFFERENTIAL/PLATELET - Abnormal; Notable for the following components:   WBC 11.7 (*)     Neutro Abs 9.2 (*)    All other components within normal limits  RESP PANEL BY RT-PCR (FLU A&B, COVID) [id number]   ____________________________________________   EKG    ____________________________________________    RADIOLOGY  DG Abdomen 1 View  Result Date: 07/21/2020 CLINICAL DATA:  Right UVJ calculus. EXAM: ABDOMEN - 1 VIEW COMPARISON:  12/05/2019 CT FINDINGS: 3 mm stone in the right pelvis which is a phlebolith by CT. There is a rounded density over the left flank which is in the soft tissues by CT. There may be a 3 mm left renal calculus. Cholecystectomy. IMPRESSION: 1. Possible 3 mm left renal  calculus. 2. No visible right urolithiasis but there is limitation by overlapping stool Electronically Signed   By: Marnee Spring M.D.   On: 07/21/2020 10:56    ____________________________________________   PROCEDURES Procedures  ____________________________________________    CLINICAL IMPRESSION / ASSESSMENT AND PLAN / ED COURSE  Medications ordered in the ED: Medications  lactated ringers bolus 1,000 mL (0 mLs Intravenous Stopped 07/21/20 1143)  morphine 4 MG/ML injection 4 mg (4 mg Intravenous Given 07/21/20 0924)  ondansetron (ZOFRAN) injection 4 mg (4 mg Intravenous Given 07/21/20 4680)    Pertinent labs & imaging results that were available during my care of the patient were reviewed by me and considered in my medical decision making (see chart for details).  Tyler Hughes was evaluated in Emergency Department on 07/21/2020 for the symptoms described in the history of present illness. He was evaluated in the context of the global COVID-19 pandemic, which necessitated consideration that the patient might be at risk for infection with the SARS-CoV-2 virus that causes COVID-19. Institutional protocols and algorithms that pertain to the evaluation of patients at risk for COVID-19 are in a state of rapid change based on information released by regulatory bodies including the  CDC and federal and state organizations. These policies and algorithms were followed during the patient's care in the ED.   Patient presents with worsening right flank pain in the setting of known 7 mm stone.  Will obtain serum labs and urinalysis.  Clinical Course as of 07/21/20 1150  Thu Jul 21, 2020  1004 Labs unremarkable, creatinine near baseline, urinalysis uninfected.  No pronounced leukocytosis.  Discussed with Dr. Richardo Hanks  who will come evaluate. [PS]    Clinical Course User Index [PS] Sharman Cheek, MD     ----------------------------------------- 11:50 AM on 07/21/2020 -----------------------------------------  Cleared for discharge by Dr. Richardo Hanks.  Plan for cystoscopy tomorrow.  ____________________________________________   FINAL CLINICAL IMPRESSION(S) / ED DIAGNOSES    Final diagnoses:  Ureterolithiasis     ED Discharge Orders         Ordered    oxyCODONE-acetaminophen (PERCOCET) 5-325 MG tablet  Every 6 hours PRN        07/21/20 1150    ondansetron (ZOFRAN ODT) 4 MG disintegrating tablet  Every 8 hours PRN        07/21/20 1150          Portions of this note were generated with dragon dictation software. Dictation errors may occur despite best attempts at proofreading.   Sharman Cheek, MD 07/21/20 1150

## 2020-07-21 NOTE — H&P (View-Only) (Signed)
   Urology Consult   I have been asked to see the patient by Dr. Stafford, for evaluation and management of right mid ureteral stone and renal colic  Chief Complaint: Right flank pain  HPI:  Tyler Hughes is a 35 y.o. year old male who presents with 2 to 3 weeks of worsening right-sided flank pain.  He was seen in the [facility] ED on 07/08/20 with similar symptoms and a CT at that time showed a 7 mm right mid ureteral stone with moderate upstream hydronephrosis.  Urinalysis showed microscopic hematuria but no definite evidence of infection, and urine culture ultimately showed no growth.  He was discharged with medical expulsive therapy.  He returns today with worsening right groin and testicular pain despite oral Toradol.  KUB today shows persistent right distal ureteral stone measuring about 8 mm.  He denies any fevers, chills, chest pain, dysuria, or shortness of breath.  Interestingly, he presented to the [facility] ED back in August 2021 with similar complaints, and CT at that time showed a 7 mm right proximal ureteral stone.  He denies ever passing that stone, and has never undergone any stone surgeries before.  I suspect this is the same stone that has now migrated distally over the last few months.  Lab work today notable for benign UA with no microscopic hematuria, sCr slightly elevated at 1.35, mild leukocytosis to 11.7.   PMH: Nephrolithiasis  Surgical History: Past Surgical History:  Procedure Laterality Date  . CHOLECYSTECTOMY    . FACIAL FRACTURE SURGERY    . FEMUR SURGERY    . OPEN REDUCTION INTERNAL FIXATION (ORIF) DISTAL RADIAL FRACTURE Right 02/24/2015   Procedure: OPEN REDUCTION INTERNAL FIXATION (ORIF) DISTAL RADIAL FRACTURE;  Surgeon: Michael Menz, MD;  Location: [facility] ORS;  Service: Orthopedics;  Laterality: Right;  . TONSILLECTOMY     Allergies: No Known Allergies  Family History: No pertinent family history  Social History:  reports that he has been smoking cigarettes.  He has been smoking about 0.50 packs per day. He has never used smokeless tobacco. He reports current alcohol use. He reports that he does not use drugs.  ROS: Negative aside from those stated in the HPI.  Physical Exam: BP 135/88   Pulse (!) 58   Temp 97.8 F (36.6 C) (Oral)   Resp 18   Ht 5' 7" (1.702 m)   Wt 72.6 kg   SpO2 100%   BMI 25.06 kg/m    Constitutional:  Alert and oriented, No acute distress. Cardiovascular: Regular rate and rhythm Respiratory: Clear to auscultation bilaterally GI: Abdomen is soft, nontender, nondistended, no abdominal masses GU: Right CVA tenderness Lymph: No cervical or inguinal lymphadenopathy. Skin: No rashes, bruises or suspicious lesions. Neurologic: Grossly intact, no focal deficits, moving all 4 extremities. Psychiatric: Normal mood and affect.  Laboratory Data: Reviewed, see HPI  Pertinent Imaging: I have personally reviewed and interpreted the CT dated 07/08/20 in [facility] showing an 8mm right mid ureteral stone with moderate upstream hydronephrosis, no right renal stones, 1400HU  Assessment & Plan:   35-year-old otherwise healthy male with worsening right-sided flank and groin pain secondary to a 8 mm right distal ureteral stone.  Based on his prior CT from August 2021, I suspect this is the same stone that is slowly migrated distally.  He has no clinical or laboratory evidence of infection.  The stone is quite dense, and since it has been in the ureter at least 6 months, I recommended ureteroscopy over   shockwave lithotripsy.  We discussed stent related symptoms at length, and possible need for longer duration of stent secondary to duration of stone in the ureter and potential for impacted stone/difficult ureteroscopy.  We discussed various treatment options for urolithiasis including observation with or without medical expulsive therapy, shockwave lithotripsy (SWL), ureteroscopy and laser lithotripsy with stent placement, and  percutaneous nephrolithotomy. We discussed that management is based on stone size, location, density, patient co-morbidities, and patient preference.   Stones <48mm in size have a >80% spontaneous passage rate. Data surrounding the use of tamsulosin for medical expulsive therapy is controversial, but meta analyses suggests it is most efficacious for distal stones between 5-70mm in size. Possible side effects include dizziness/lightheadedness, and retrograde ejaculation.  SWL has a lower stone free rate in a single procedure, but also a lower complication rate compared to ureteroscopy and avoids a stent and associated stent related symptoms. Possible complications include renal hematoma, steinstrasse, and need for additional treatment.  Ureteroscopy with laser lithotripsy and stent placement has a higher stone free rate than SWL in a single procedure, however increased complication rate including possible infection, ureteral injury, bleeding, and stent related morbidity. Common stent related symptoms include dysuria, urgency/frequency, and flank pain.  After an extensive discussion of the risks and benefits of the above treatment options, the patient would like to proceed with right ureteroscopy, laser lithotripsy, and stent placement tomorrow.  Recommendations:  -Will schedule right ureteroscopy, laser lithotripsy, and stent placement tomorrow  Sondra Come, MD  [facility] [address], [address] [place], [place] [address] [phone]

## 2020-07-21 NOTE — ED Triage Notes (Signed)
Pt with right flank pain.pain now 8/10. Per pt he has a known 60mm right stone. Pt has been taking Toradol for pain.

## 2020-07-21 NOTE — Discharge Instructions (Addendum)
Expect a call from the Urology scheduler today to plan for removal of your kidney stone.  Follow their instructions about when/where to return.  We completed a Covid screening test today which was negative.

## 2020-07-21 NOTE — Consult Note (Signed)
Urology Consult   I have been asked to see the patient by Dr. Scotty Court, for evaluation and management of right mid ureteral stone and renal colic  Chief Complaint: Right flank pain  HPI:  Tyler Hughes is a 35 y.o. year old male who presents with 2 to 3 weeks of worsening right-sided flank pain.  He was seen in the [facility] ED on 07/08/20 with similar symptoms and a CT at that time showed a 7 mm right mid ureteral stone with moderate upstream hydronephrosis.  Urinalysis showed microscopic hematuria but no definite evidence of infection, and urine culture ultimately showed no growth.  He was discharged with medical expulsive therapy.  He returns today with worsening right groin and testicular pain despite oral Toradol.  KUB today shows persistent right distal ureteral stone measuring about 8 mm.  He denies any fevers, chills, chest pain, dysuria, or shortness of breath.  Interestingly, he presented to the [facility] ED back in August 2021 with similar complaints, and CT at that time showed a 7 mm right proximal ureteral stone.  He denies ever passing that stone, and has never undergone any stone surgeries before.  I suspect this is the same stone that has now migrated distally over the last few months.  Lab work today notable for benign UA with no microscopic hematuria, sCr slightly elevated at 1.35, mild leukocytosis to 11.7.   PMH: Nephrolithiasis  Surgical History: Past Surgical History:  Procedure Laterality Date  . CHOLECYSTECTOMY    . FACIAL FRACTURE SURGERY    . FEMUR SURGERY    . OPEN REDUCTION INTERNAL FIXATION (ORIF) DISTAL RADIAL FRACTURE Right 02/24/2015   Procedure: OPEN REDUCTION INTERNAL FIXATION (ORIF) DISTAL RADIAL FRACTURE;  Surgeon: Kennedy Bucker, MD;  Location: [facility] ORS;  Service: Orthopedics;  Laterality: Right;  . TONSILLECTOMY     Allergies: No Known Allergies  Family History: No pertinent family history  Social History:  reports that he has been smoking cigarettes.  He has been smoking about 0.50 packs per day. He has never used smokeless tobacco. He reports current alcohol use. He reports that he does not use drugs.  ROS: Negative aside from those stated in the HPI.  Physical Exam: BP 135/88   Pulse (!) 58   Temp 97.8 F (36.6 C) (Oral)   Resp 18   Ht 5\' 7"  (1.702 m)   Wt 72.6 kg   SpO2 100%   BMI 25.06 kg/m    Constitutional:  Alert and oriented, No acute distress. Cardiovascular: Regular rate and rhythm Respiratory: Clear to auscultation bilaterally GI: Abdomen is soft, nontender, nondistended, no abdominal masses GU: Right CVA tenderness Lymph: No cervical or inguinal lymphadenopathy. Skin: No rashes, bruises or suspicious lesions. Neurologic: Grossly intact, no focal deficits, moving all 4 extremities. Psychiatric: Normal mood and affect.  Laboratory Data: Reviewed, see HPI  Pertinent Imaging: I have personally reviewed and interpreted the CT dated 07/08/20 in [facility] showing an 62mm right mid ureteral stone with moderate upstream hydronephrosis, no right renal stones, 1400HU  Assessment & Plan:   35 year old otherwise healthy male with worsening right-sided flank and groin pain secondary to a 8 mm right distal ureteral stone.  Based on his prior CT from August 2021, I suspect this is the same stone that is slowly migrated distally.  He has no clinical or laboratory evidence of infection.  The stone is quite dense, and since it has been in the ureter at least 6 months, I recommended ureteroscopy over  shockwave lithotripsy.  We discussed stent related symptoms at length, and possible need for longer duration of stent secondary to duration of stone in the ureter and potential for impacted stone/difficult ureteroscopy.  We discussed various treatment options for urolithiasis including observation with or without medical expulsive therapy, shockwave lithotripsy (SWL), ureteroscopy and laser lithotripsy with stent placement, and  percutaneous nephrolithotomy. We discussed that management is based on stone size, location, density, patient co-morbidities, and patient preference.   Stones <48mm in size have a >80% spontaneous passage rate. Data surrounding the use of tamsulosin for medical expulsive therapy is controversial, but meta analyses suggests it is most efficacious for distal stones between 5-70mm in size. Possible side effects include dizziness/lightheadedness, and retrograde ejaculation.  SWL has a lower stone free rate in a single procedure, but also a lower complication rate compared to ureteroscopy and avoids a stent and associated stent related symptoms. Possible complications include renal hematoma, steinstrasse, and need for additional treatment.  Ureteroscopy with laser lithotripsy and stent placement has a higher stone free rate than SWL in a single procedure, however increased complication rate including possible infection, ureteral injury, bleeding, and stent related morbidity. Common stent related symptoms include dysuria, urgency/frequency, and flank pain.  After an extensive discussion of the risks and benefits of the above treatment options, the patient would like to proceed with right ureteroscopy, laser lithotripsy, and stent placement tomorrow.  Recommendations:  -Will schedule right ureteroscopy, laser lithotripsy, and stent placement tomorrow  Sondra Come, MD  [facility] [address], [address] [place], [place] [address] [phone]

## 2020-07-22 ENCOUNTER — Ambulatory Visit: Payer: Self-pay | Admitting: Anesthesiology

## 2020-07-22 ENCOUNTER — Encounter
Admission: RE | Disposition: A | Discharge status: Home / Self Care | Payer: Self-pay | Source: Home / Self Care | Attending: Urology

## 2020-07-22 ENCOUNTER — Encounter: Payer: Self-pay | Admitting: Urology

## 2020-07-22 ENCOUNTER — Other Ambulatory Visit: Payer: Self-pay

## 2020-07-22 ENCOUNTER — Ambulatory Visit: Payer: Self-pay

## 2020-07-22 ENCOUNTER — Ambulatory Visit
Admission: RE | Admit: 2020-07-22 | Discharge: 2020-07-22 | Disposition: A | Discharge status: Home / Self Care | Payer: Self-pay | Attending: Urology | Admitting: Urology

## 2020-07-22 DIAGNOSIS — N132 Hydronephrosis with renal and ureteral calculous obstruction: Secondary | ICD-10-CM | POA: Insufficient documentation

## 2020-07-22 DIAGNOSIS — N201 Calculus of ureter: Secondary | ICD-10-CM

## 2020-07-22 DIAGNOSIS — F17210 Nicotine dependence, cigarettes, uncomplicated: Secondary | ICD-10-CM | POA: Insufficient documentation

## 2020-07-22 DIAGNOSIS — N50811 Right testicular pain: Secondary | ICD-10-CM | POA: Insufficient documentation

## 2020-07-22 HISTORY — PX: CYSTOSCOPY/URETEROSCOPY/HOLMIUM LASER/STENT PLACEMENT: SHX6546

## 2020-07-22 SURGERY — CYSTOSCOPY/URETEROSCOPY/HOLMIUM LASER/STENT PLACEMENT
Anesthesia: General | Laterality: Right

## 2020-07-22 MED ORDER — FENTANYL CITRATE (PF) 100 MCG/2ML IJ SOLN
INTRAMUSCULAR | Status: AC
  Filled 2020-07-22: qty 2

## 2020-07-22 MED ORDER — ONDANSETRON HCL 4 MG/2ML IJ SOLN
INTRAMUSCULAR | Status: DC | PRN
  Administered 2020-07-22: 4 mg via INTRAVENOUS

## 2020-07-22 MED ORDER — MIDAZOLAM HCL 2 MG/2ML IJ SOLN
INTRAMUSCULAR | Status: AC
  Filled 2020-07-22: qty 2

## 2020-07-22 MED ORDER — ONDANSETRON HCL 4 MG/2ML IJ SOLN: 4.0000 mg | Freq: Once | INTRAMUSCULAR | Status: DC | PRN

## 2020-07-22 MED ORDER — FENTANYL CITRATE (PF) 100 MCG/2ML IJ SOLN
INTRAMUSCULAR | Status: DC | PRN
  Administered 2020-07-22: 100 ug via INTRAVENOUS

## 2020-07-22 MED ORDER — BELLADONNA ALKALOIDS-OPIUM 16.2-60 MG RE SUPP
RECTAL | Status: AC
  Filled 2020-07-22: qty 1

## 2020-07-22 MED ORDER — SUGAMMADEX SODIUM 500 MG/5ML IV SOLN
INTRAVENOUS | Status: DC | PRN
  Administered 2020-07-22: 400 mg via INTRAVENOUS

## 2020-07-22 MED ORDER — FENTANYL CITRATE (PF) 100 MCG/2ML IJ SOLN: 25.0000 ug | INTRAMUSCULAR | Status: DC | PRN

## 2020-07-22 MED ORDER — ROCURONIUM BROMIDE 100 MG/10ML IV SOLN
INTRAVENOUS | Status: DC | PRN
  Administered 2020-07-22: 50 mg via INTRAVENOUS

## 2020-07-22 MED ORDER — CEFAZOLIN SODIUM-DEXTROSE 2-4 GM/100ML-% IV SOLN
INTRAVENOUS | Status: AC
  Filled 2020-07-22: qty 100

## 2020-07-22 MED ORDER — PROPOFOL 10 MG/ML IV BOLUS
INTRAVENOUS | Status: DC | PRN
  Administered 2020-07-22: 200 mg via INTRAVENOUS

## 2020-07-22 MED ORDER — LACTATED RINGERS IV SOLN: INTRAVENOUS | Status: DC

## 2020-07-22 MED ORDER — GLYCOPYRROLATE 0.2 MG/ML IJ SOLN
INTRAMUSCULAR | Status: DC | PRN
  Administered 2020-07-22: .2 mg via INTRAVENOUS

## 2020-07-22 MED ORDER — CEFAZOLIN SODIUM-DEXTROSE 2-4 GM/100ML-% IV SOLN
2.0000 g | INTRAVENOUS | Status: AC
  Administered 2020-07-22: 2 g via INTRAVENOUS

## 2020-07-22 MED ORDER — ORAL CARE MOUTH RINSE: 15.0000 mL | Freq: Once | OROMUCOSAL | Status: AC

## 2020-07-22 MED ORDER — ACETAMINOPHEN 10 MG/ML IV SOLN
INTRAVENOUS | Status: AC
  Filled 2020-07-22: qty 100

## 2020-07-22 MED ORDER — IOHEXOL 180 MG/ML  SOLN
INTRAMUSCULAR | Status: DC | PRN
  Administered 2020-07-22: 16 mL

## 2020-07-22 MED ORDER — CHLORHEXIDINE GLUCONATE 0.12 % MT SOLN
OROMUCOSAL | Status: AC
  Administered 2020-07-22: 15 mL via OROMUCOSAL
  Filled 2020-07-22: qty 15

## 2020-07-22 MED ORDER — DEXAMETHASONE SODIUM PHOSPHATE 10 MG/ML IJ SOLN
INTRAMUSCULAR | Status: DC | PRN
  Administered 2020-07-22: 10 mg via INTRAVENOUS

## 2020-07-22 MED ORDER — ACETAMINOPHEN 10 MG/ML IV SOLN: 1000.0000 mg | Freq: Once | INTRAVENOUS | Status: DC | PRN

## 2020-07-22 MED ORDER — KETOROLAC TROMETHAMINE 30 MG/ML IJ SOLN
INTRAMUSCULAR | Status: DC | PRN
  Administered 2020-07-22: 15 mg via INTRAVENOUS

## 2020-07-22 MED ORDER — ACETAMINOPHEN 10 MG/ML IV SOLN
INTRAVENOUS | Status: DC | PRN
  Administered 2020-07-22: 1000 mg via INTRAVENOUS

## 2020-07-22 MED ORDER — SULFAMETHOXAZOLE-TRIMETHOPRIM 800-160 MG PO TABS: 1.0000 | ORAL_TABLET | Freq: Once | ORAL | 0 refills | Status: AC

## 2020-07-22 MED ORDER — OXYCODONE HCL 5 MG/5ML PO SOLN: 5.0000 mg | Freq: Once | ORAL | Status: DC | PRN

## 2020-07-22 MED ORDER — LIDOCAINE HCL (CARDIAC) PF 100 MG/5ML IV SOSY
PREFILLED_SYRINGE | INTRAVENOUS | Status: DC | PRN
  Administered 2020-07-22: 100 mg via INTRAVENOUS

## 2020-07-22 MED ORDER — CHLORHEXIDINE GLUCONATE 0.12 % MT SOLN: 15.0000 mL | Freq: Once | OROMUCOSAL | Status: AC

## 2020-07-22 MED ORDER — MIDAZOLAM HCL 2 MG/2ML IJ SOLN
INTRAMUSCULAR | Status: DC | PRN
  Administered 2020-07-22: 2 mg via INTRAVENOUS

## 2020-07-22 MED ORDER — LIDOCAINE HCL (PF) 2 % IJ SOLN
INTRAMUSCULAR | Status: AC
  Filled 2020-07-22: qty 10

## 2020-07-22 MED ORDER — OXYCODONE HCL 5 MG PO TABS: 5.0000 mg | ORAL_TABLET | Freq: Once | ORAL | Status: DC | PRN

## 2020-07-22 MED ORDER — DEXMEDETOMIDINE (PRECEDEX) IN NS 20 MCG/5ML (4 MCG/ML) IV SYRINGE
PREFILLED_SYRINGE | INTRAVENOUS | Status: DC | PRN
  Administered 2020-07-22: 16 ug via INTRAVENOUS
  Administered 2020-07-22: 20 ug via INTRAVENOUS

## 2020-07-22 SURGICAL SUPPLY — 31 items
BAG DRAIN CYSTO-URO LG1000N (MISCELLANEOUS) ×2 IMPLANT
BASKET ZERO TIP 1.9FR (BASKET) ×2 IMPLANT
BRUSH SCRUB EZ 1% IODOPHOR (MISCELLANEOUS) ×2 IMPLANT
BSKT STON RTRVL ZERO TP 1.9FR (BASKET) ×1
CATH URET FLEX-TIP 2 LUMEN 10F (CATHETERS) IMPLANT
CATH URETL 5X70 OPEN END (CATHETERS) IMPLANT
CNTNR SPEC 2.5X3XGRAD LEK (MISCELLANEOUS) ×1
CONT SPEC 4OZ STER OR WHT (MISCELLANEOUS) ×1
CONT SPEC 4OZ STRL OR WHT (MISCELLANEOUS) ×1
CONTAINER SPEC 2.5X3XGRAD LEK (MISCELLANEOUS) ×1 IMPLANT
DRAPE UTILITY 15X26 TOWEL STRL (DRAPES) ×2 IMPLANT
DRSG TEGADERM 2-3/8X2-3/4 SM (GAUZE/BANDAGES/DRESSINGS) ×2 IMPLANT
GLOVE SURG UNDER POLY LF SZ7.5 (GLOVE) ×2 IMPLANT
GOWN STRL REUS W/ TWL LRG LVL3 (GOWN DISPOSABLE) ×1 IMPLANT
GOWN STRL REUS W/ TWL XL LVL3 (GOWN DISPOSABLE) ×1 IMPLANT
GOWN STRL REUS W/TWL LRG LVL3 (GOWN DISPOSABLE) ×2
GOWN STRL REUS W/TWL XL LVL3 (GOWN DISPOSABLE) ×2
GUIDEWIRE STR DUAL SENSOR (WIRE) ×2 IMPLANT
INFUSOR MANOMETER BAG 3000ML (MISCELLANEOUS) ×2 IMPLANT
IV NS IRRIG 3000ML ARTHROMATIC (IV SOLUTION) ×2 IMPLANT
KIT TURNOVER CYSTO (KITS) ×2 IMPLANT
PACK CYSTO AR (MISCELLANEOUS) ×2 IMPLANT
SET CYSTO W/LG BORE CLAMP LF (SET/KITS/TRAYS/PACK) ×2 IMPLANT
SHEATH URETERAL 12FRX35CM (MISCELLANEOUS) IMPLANT
STENT URET 6FRX24 CONTOUR (STENTS) IMPLANT
STENT URET 6FRX26 CONTOUR (STENTS) ×2 IMPLANT
SURGILUBE 2OZ TUBE FLIPTOP (MISCELLANEOUS) ×2 IMPLANT
SYR 10ML LL (SYRINGE) ×2 IMPLANT
TRACTIP FLEXIVA PULSE ID 200 (Laser) ×2 IMPLANT
VALVE UROSEAL ADJ ENDO (VALVE) ×2 IMPLANT
WATER STERILE IRR 1000ML POUR (IV SOLUTION) ×2 IMPLANT

## 2020-07-22 NOTE — Anesthesia Preprocedure Evaluation (Signed)
Anesthesia Evaluation  Patient identified by MRN, date of birth, ID band Patient awake    Reviewed: Allergy & Precautions, NPO status , Patient's Chart, lab work & pertinent test results  History of Anesthesia Complications Negative for: history of anesthetic complications  Airway Mallampati: II  TM Distance: >3 FB Neck ROM: Full    Dental no notable dental hx. (+) Teeth Intact   Pulmonary neg sleep apnea, neg COPD, Current SmokerPatient did not abstain from smoking.,    Pulmonary exam normal breath sounds clear to auscultation       Cardiovascular Exercise Tolerance: Good METS(-) hypertension(-) CAD and (-) Past MI negative cardio ROS  (-) dysrhythmias  Rhythm:Regular Rate:Normal - Systolic murmurs    Neuro/Psych negative neurological ROS  negative psych ROS   GI/Hepatic neg GERD  ,(+)     substance abuse  alcohol use,   Endo/Other  neg diabetes  Renal/GU negative Renal ROS     Musculoskeletal   Abdominal   Peds  Hematology   Anesthesia Other Findings History reviewed. No pertinent past medical history.  Reproductive/Obstetrics                             Anesthesia Physical Anesthesia Plan  ASA: II  Anesthesia Plan: General   Post-op Pain Management:    Induction: Intravenous  PONV Risk Score and Plan: 2 and Ondansetron, Dexamethasone and Midazolam  Airway Management Planned: Oral ETT  Additional Equipment: None  Intra-op Plan:   Post-operative Plan: Extubation in OR  Informed Consent: I have reviewed the patients History and Physical, chart, labs and discussed the procedure including the risks, benefits and alternatives for the proposed anesthesia with the patient or authorized representative who has indicated his/her understanding and acceptance.     Dental advisory given  Plan Discussed with: CRNA and Surgeon  Anesthesia Plan Comments: (Discussed risks of  anesthesia with patient, including PONV, sore throat, lip/dental damage. Rare risks discussed as well, such as cardiorespiratory and neurological sequelae. Patient understands.)        Anesthesia Quick Evaluation

## 2020-07-22 NOTE — Discharge Instructions (Signed)

## 2020-07-22 NOTE — Anesthesia Procedure Notes (Signed)
Procedure Name: Intubation Performed by: Fletcher-Harrison, Ryan Ogborn, CRNA Pre-anesthesia Checklist: Patient identified, Emergency Drugs available, Suction available and Patient being monitored Patient Re-evaluated:Patient Re-evaluated prior to induction Oxygen Delivery Method: Circle system utilized Preoxygenation: Pre-oxygenation with 100% oxygen Induction Type: IV induction Ventilation: Mask ventilation without difficulty Laryngoscope Size: McGraph and 3 Grade View: Grade I Tube type: Oral Tube size: 7.0 mm Number of attempts: 1 Airway Equipment and Method: Stylet and Oral airway Placement Confirmation: ETT inserted through vocal cords under direct vision,  positive ETCO2,  breath sounds checked- equal and bilateral and CO2 detector Secured at: 21 cm Tube secured with: Tape Dental Injury: Teeth and Oropharynx as per pre-operative assessment        

## 2020-07-22 NOTE — Op Note (Signed)
Date of procedure: 07/22/20  Preoperative diagnosis:  1. Right distal ureteral stone  Postoperative diagnosis:  1. Same  Procedure: 1. Cystoscopy, right ureteroscopy, laser lithotripsy, basket extraction of fragments, right retrograde pyelogram with intraoperative interpretation, right ureteral stent placement  Surgeon: Legrand Rams, MD  Anesthesia: General  Complications: None  Intraoperative findings:  1.  Normal cystoscopy 2.  Right distal ureteral stone fragmented and basket extracted, tight right ureter 3.  Uncomplicated stent placement with Dangler  EBL: Minimal  Specimens: None  Drains: Right 6 French by 26 cm ureteral stent with Dangler  Indication: Tyler Hughes is a 35 y.o. patient with ongoing renal colic and CT showing a 7 mm right mid ureteral stone present at least 3 weeks.  After reviewing the management options for treatment, they elected to proceed with the above surgical procedure(s). We have discussed the potential benefits and risks of the procedure, side effects of the proposed treatment, the likelihood of the patient achieving the goals of the procedure, and any potential problems that might occur during the procedure or recuperation. Informed consent has been obtained.  Description of procedure:  The patient was taken to the operating room and general anesthesia was induced. SCDs were placed for DVT prophylaxis. The patient was placed in the dorsal lithotomy position, prepped and draped in the usual sterile fashion, and preoperative antibiotics(Ancef) were administered. A preoperative time-out was performed.   A 21 French rigid cystoscope was used to intubate the urethra and a normal-appearing urethra was followed proximally into the bladder.  Thorough cystoscopy was performed and the bladder was grossly normal.  I started by inserting a 5 [place] access catheter into the right ureteral orifice and a retrograde pyelogram showed a filling defect in the  distal ureter with upstream hydronephrosis.  A sensor wire advanced easily through the catheter and alongside the stone into the kidney.  A semirigid ureteroscope was advanced alongside the wire and the distal ureter was very tight.  I was able to advance this carefully up to the mid ureter were identified a large yellow 7 mm stone.  This appeared to be calcium oxalate.  A 242 m laser fiber on settings of 1.0 J and 10 Hz was used to fragment the stone to multiple pieces.  A basket was used to extract all of these from the ureter and deposited in the bladder.  Thorough inspection of the ureter revealed no other fragments or ureteral injury.  A retrograde pyelogram performed from the distal ureter showed no extravasation or filling defects.  The rigid cystoscope was backloaded over the wire, and a 6 [place] by 26 cm ureteral stent was uneventfully placed on the right side with an excellent curl in the upper pole, as well as under direct vision in the bladder.  The stone fragments were irrigated free, and the bladder was drained.  The Dangler was secured to the phallus using Mastisol and Tegaderm.  A belladonna suppository was placed.  Disposition: Stable to PACU  Plan: Remove stent at home on Wednesday 3/30, Bactrim prior to removal Renal ultrasound in 1 month to evaluate for silent hydronephrosis  Legrand Rams, MD

## 2020-07-22 NOTE — Interval H&P Note (Signed)
UROLOGY H&P UPDATE  Agree with prior H&P dated 07/21/20.  Cardiac: RRR Lungs: CTA bilaterally  Laterality: Right Procedure: Ureteroscopy, laser lithotripsy, stent placement  We specifically discussed the risks ureteroscopy including bleeding, infection/sepsis, stent related symptoms including flank pain/urgency/frequency/incontinence/dysuria, ureteral injury, inability to access stone, or need for staged or additional procedures.   Sondra Come, MD 07/22/2020

## 2020-07-22 NOTE — Anesthesia Postprocedure Evaluation (Signed)
Anesthesia Post Note  Patient: Tyler Hughes  Procedure(s) Performed: CYSTOSCOPY/URETEROSCOPY/HOLMIUM LASER/STENT PLACEMENT (Right )  Patient location during evaluation: PACU Anesthesia Type: General Level of consciousness: awake and alert Pain management: pain level controlled Vital Signs Assessment: post-procedure vital signs reviewed and stable Respiratory status: spontaneous breathing, nonlabored ventilation, respiratory function stable and patient connected to nasal cannula oxygen Cardiovascular status: blood pressure returned to baseline and stable Postop Assessment: no apparent nausea or vomiting Anesthetic complications: no   No complications documented.   Last Vitals:  Vitals:   07/22/20 1353 07/22/20 1400  BP: 132/81 126/83  Pulse: 62 63  Resp: (!) 23 10  Temp: 36.9 C 36.9 C  SpO2: 100% 100%    Last Pain:  Vitals:   07/22/20 1400  TempSrc: Temporal  PainSc: 0-No pain                 Corinda Gubler

## 2020-07-22 NOTE — Transfer of Care (Signed)
Immediate Anesthesia Transfer of Care Note  Patient: Tyler Hughes  Procedure(s) Performed: CYSTOSCOPY/URETEROSCOPY/HOLMIUM LASER/STENT PLACEMENT (Right )  Patient Location: PACU  Anesthesia Type:General  Level of Consciousness: awake, drowsy and patient cooperative  Airway & Oxygen Therapy: Patient Spontanous Breathing and Patient connected to face mask oxygen  Post-op Assessment: Report given to RN and Post -op Vital signs reviewed and stable  Post vital signs: Reviewed and stable  Last Vitals:  Vitals Value Taken Time  BP 122/82 07/22/20 1326  Temp 36.7 C 07/22/20 1326  Pulse 80 07/22/20 1330  Resp 18 07/22/20 1330  SpO2 92 % 07/22/20 1330  Vitals shown include unvalidated device data.  Last Pain:  Vitals:   07/22/20 1049  TempSrc: Oral  PainSc: 0-No pain         Complications: No complications documented.

## 2020-07-23 ENCOUNTER — Encounter: Payer: Self-pay | Admitting: Urology

## 2020-07-27 ENCOUNTER — Other Ambulatory Visit: Payer: Self-pay

## 2020-07-27 ENCOUNTER — Ambulatory Visit: Payer: Self-pay

## 2020-07-27 DIAGNOSIS — N201 Calculus of ureter: Secondary | ICD-10-CM

## 2020-07-27 NOTE — Progress Notes (Signed)
Patient came in for string stent removal. Stent removed by Amaree Loisel O`Sullivan, RMA no complications were noted. Pt tolerated well. Pt did take his antibiotic 30 min prior to stent removal.

## 2020-08-10 ENCOUNTER — Ambulatory Visit (INDEPENDENT_AMBULATORY_CARE_PROVIDER_SITE_OTHER): Payer: Self-pay | Admitting: Urology

## 2020-08-10 ENCOUNTER — Other Ambulatory Visit: Payer: Self-pay

## 2020-08-10 ENCOUNTER — Encounter: Payer: Self-pay | Admitting: Urology

## 2020-08-10 VITALS — BP 133/81 | HR 76 | Ht 67.0 in | Wt 163.0 lb

## 2020-08-10 DIAGNOSIS — R310 Gross hematuria: Secondary | ICD-10-CM

## 2020-08-10 MED ORDER — TAMSULOSIN HCL 0.4 MG PO CAPS
0.4000 mg | ORAL_CAPSULE | Freq: Every day | ORAL | 0 refills | Status: DC
Start: 2020-08-10 — End: 2021-10-07

## 2020-08-10 MED ORDER — SULFAMETHOXAZOLE-TRIMETHOPRIM 800-160 MG PO TABS: 1.0000 | ORAL_TABLET | Freq: Two times a day (BID) | ORAL | 0 refills | Status: DC

## 2020-08-10 NOTE — Progress Notes (Signed)
08/10/2020 8:31 AM   Tyler Hughes 1986/04/19 [id number]  Referring provider: No referring provider defined for this encounter.  Chief Complaint  Patient presents with  . Hematuria   Urological history: 1.  Nephrolithiasis -s/p right URS 07/22/2020  HPI: Tyler Hughes is a 35 y.o. male who presents today for an urgent appointment for complaints of dysuria.    He explains this as a right-sided burning sensation associated with passage of some gross hematuria.  He denies any pain with urination.  He states the sensation started yesterday.  Up to that point he had no issues postoperatively.  He had his stent pulled in office by [place] as it was on a Dangler.  He states he had not had any issues since this stent had been removed until yesterday.  Patient denies any modifying or aggravating factors.  Patient denies any gross hematuria, dysuria or suprapubic/flank pain.  Patient denies any fevers, chills, nausea or vomiting.   UA 6-10 WBC's, >30 RBC's and few bacteria.  PMH: No past medical history on file.  Surgical History: Past Surgical History:  Procedure Laterality Date  . CHOLECYSTECTOMY    . CYSTOSCOPY/URETEROSCOPY/HOLMIUM LASER/STENT PLACEMENT Right 07/22/2020   Procedure: CYSTOSCOPY/URETEROSCOPY/HOLMIUM LASER/STENT PLACEMENT;  Surgeon: Sondra Come, MD;  Location: [facility] ORS;  Service: Urology;  Laterality: Right;  . FACIAL FRACTURE SURGERY    . FEMUR SURGERY    . OPEN REDUCTION INTERNAL FIXATION (ORIF) DISTAL RADIAL FRACTURE Right 02/24/2015   Procedure: OPEN REDUCTION INTERNAL FIXATION (ORIF) DISTAL RADIAL FRACTURE;  Surgeon: Kennedy Bucker, MD;  Location: [facility] ORS;  Service: Orthopedics;  Laterality: Right;  . TONSILLECTOMY      Home Medications:  Allergies as of 08/10/2020   No Known Allergies     Medication List       Accurate as of August 10, 2020 11:59 PM. If you have any questions, ask your nurse or doctor.        famotidine 20 MG tablet Commonly  known as: PEPCID Take 1 tablet (20 mg total) by mouth 2 (two) times daily.   ketorolac 10 MG tablet Commonly known as: TORADOL Take 1 tablet (10 mg total) by mouth every 8 (eight) hours.   ondansetron 4 MG disintegrating tablet Commonly known as: Zofran ODT Take 1 tablet (4 mg total) by mouth every 8 (eight) hours as needed for nausea or vomiting.   sulfamethoxazole-trimethoprim 800-160 MG tablet Commonly known as: BACTRIM DS Take 1 tablet by mouth every 12 (twelve) hours. Started by: Michiel Cowboy, PA-C   tamsulosin 0.4 MG Caps capsule Commonly known as: FLOMAX Take 1 capsule (0.4 mg total) by mouth daily.       Allergies: No Known Allergies  Family History: No family history on file.  Social History:  reports that he has been smoking cigarettes. He has been smoking about 0.50 packs per day. He has never used smokeless tobacco. He reports current alcohol use. He reports that he does not use drugs.  ROS: Pertinent ROS in HPI  Physical Exam: BP 133/81   Pulse 76   Ht 5\' 7"  (1.702 m)   Wt 163 lb (73.9 kg)   BMI 25.53 kg/m   Constitutional:  Well nourished. Alert and oriented, No acute distress. HEENT: [place] AT, mask in place.  Trachea midline Cardiovascular: No clubbing, cyanosis, or edema. Respiratory: Normal respiratory effort, no increased work of breathing. Neurologic: Grossly intact, no focal deficits, moving all 4 extremities. Psychiatric: Normal mood and affect.  Laboratory Data: Lab  Results  Component Value Date   WBC 11.7 (H) 07/21/2020   HGB 14.2 07/21/2020   HCT 40.0 07/21/2020   MCV 86.2 07/21/2020   PLT 202 07/21/2020    Lab Results  Component Value Date   CREATININE 1.35 (H) 07/21/2020    Lab Results  Component Value Date   AST 84 (H) 07/21/2020   Lab Results  Component Value Date   ALT 58 (H) 07/21/2020    Urinalysis Component     Latest Ref Rng & Units 08/10/2020  Specific Gravity, UA     1.005 - 1.030 1.025  pH, UA     5.0 -  7.5 6.0  Color, UA     Yellow Orange  Appearance Ur     Clear Cloudy (A)  Leukocytes,UA     Negative Negative  Protein,UA     Negative/Trace 1+ (A)  Glucose, UA     Negative Negative  Ketones, UA     Negative Negative  RBC, UA     Negative 3+ (A)  Bilirubin, UA     Negative Negative  Urobilinogen, Ur     0.2 - 1.0 mg/dL 1.0  Nitrite, UA     Negative Negative  Microscopic Examination      See below:   Component     Latest Ref Rng & Units 08/10/2020  WBC, UA     0 - 5 /hpf 6-10 (A)  RBC     0 - 2 /hpf >30 (A)  Epithelial Cells (non renal)     0 - 10 /hpf 0-10  Bacteria, UA     None seen/Few Few  I have reviewed the labs.   Pertinent Imaging: No imaging since last visit   Assessment & Plan:    1. Nephrolithiasis -s/p right URS -follow up RUS pending  2. Dysuria -pain in the right side during urination -may be secondary to stent irritation, debris or UTI -stent was removed by nursing staff in our office -urine culture is pending   Return for pending urine culture results.  These notes generated with voice recognition software. I apologize for typographical errors.  Michiel Cowboy, PA-C  [facility] [address]  [address] [place], [place] [address] [phone]

## 2020-08-11 ENCOUNTER — Telehealth: Payer: Self-pay | Admitting: Urology

## 2020-08-11 LAB — URINALYSIS, COMPLETE
Bilirubin, UA: NEGATIVE
Glucose, UA: NEGATIVE
Ketones, UA: NEGATIVE
Leukocytes,UA: NEGATIVE
Nitrite, UA: NEGATIVE
Specific Gravity, UA: 1.025 (ref 1.005–1.030)
Urobilinogen, Ur: 1 mg/dL (ref 0.2–1.0)
pH, UA: 6 (ref 5.0–7.5)

## 2020-08-11 LAB — MICROSCOPIC EXAMINATION: RBC, Urine: >30 /hpf — AB (ref 0–2)

## 2020-08-13 LAB — CULTURE, URINE COMPREHENSIVE

## 2020-09-12 ENCOUNTER — Telehealth: Payer: Self-pay | Admitting: Urology

## 2020-09-12 NOTE — Telephone Encounter (Signed)
Would you check on the status of his RUS? 

## 2020-10-03 NOTE — Telephone Encounter (Signed)
Error

## 2020-10-12 ENCOUNTER — Ambulatory Visit
Admission: RE | Admit: 2020-10-12 | Discharge: 2020-10-12 | Disposition: A | Discharge status: Home / Self Care | Payer: Self-pay | Source: Ambulatory Visit | Attending: Urology | Admitting: Urology

## 2020-10-12 ENCOUNTER — Other Ambulatory Visit: Payer: Self-pay

## 2020-10-12 DIAGNOSIS — N201 Calculus of ureter: Secondary | ICD-10-CM | POA: Insufficient documentation

## 2020-10-13 ENCOUNTER — Telehealth: Payer: Self-pay

## 2020-10-13 NOTE — Telephone Encounter (Signed)
Patient notified. He states he is having intermittent right flank pain which he believes indicates that his "ureter is blocked". He also states that the u/s tech told him that his ureter was not seen on u/s due to his hip blocking the image and "he knows something is there" and that is why he is having pain. Patient would like to know how to proceed to find out if imaging of the ureter can be done to find his pain. Please advise

## 2020-10-13 NOTE — Telephone Encounter (Signed)
-----   Message from Sondra Come, MD sent at 10/13/2020  9:18 AM EDT ----- Renal ultrasound was normal, no evidence of hydronephrosis or kidney stones, follow-up as needed  Legrand Rams, MD 10/13/2020

## 2020-10-14 NOTE — Telephone Encounter (Signed)
It looks like he has seen Carollee Herter a few times since surgery.  Would recommend seeing Carollee Herter with a urinalysis and culture, and if any persistent microscopic hematuria or pyuria order a CT stone protocol to evaluate for any residual fragments.  Renal ultrasound being normal is reassuring though    Legrand Rams, MD

## 2020-10-14 NOTE — Telephone Encounter (Signed)
Patient notified and apt made for next week

## 2020-10-19 NOTE — Progress Notes (Deleted)
10/20/2020 1:17 PM   Tyler Hughes Nov 11, 1985 [id number]  Referring provider: No referring provider defined for this encounter.  No chief complaint on file.  Urological history: 1.  Nephrolithiasis -s/p right URS 07/22/2020  HPI: Tyler Hughes is a 35 y.o. male who presents today for an urgent appointment for complaints of dysuria.    He explains this as a right-sided burning sensation associated with passage of some gross hematuria.  He denies any pain with urination.  He states the sensation started yesterday.  Up to that point he had no issues postoperatively.  He had his stent pulled in office by [place] as it was on a Dangler.  He states he had not had any issues since this stent had been removed until yesterday.  Patient denies any modifying or aggravating factors.  Patient denies any gross hematuria, dysuria or suprapubic/flank pain.  Patient denies any fevers, chills, nausea or vomiting.   UA 6-10 WBC's, >30 RBC's and few bacteria.  PMH: No past medical history on file.  Surgical History: Past Surgical History:  Procedure Laterality Date   CHOLECYSTECTOMY     CYSTOSCOPY/URETEROSCOPY/HOLMIUM LASER/STENT PLACEMENT Right 07/22/2020   Procedure: CYSTOSCOPY/URETEROSCOPY/HOLMIUM LASER/STENT PLACEMENT;  Surgeon: Sondra Come, MD;  Location: [facility] ORS;  Service: Urology;  Laterality: Right;   FACIAL FRACTURE SURGERY     FEMUR SURGERY     OPEN REDUCTION INTERNAL FIXATION (ORIF) DISTAL RADIAL FRACTURE Right 02/24/2015   Procedure: OPEN REDUCTION INTERNAL FIXATION (ORIF) DISTAL RADIAL FRACTURE;  Surgeon: Kennedy Bucker, MD;  Location: [facility] OR[facility];  Service: Orthopedics;  Laterality: Right;   TONSILLECTOMY      Home Medications:  Allergies as of 10/20/2020   No Known Allergies      Medication List        Accurate as of October 19, 2020  1:17 PM. If you have any questions, ask your nurse or doctor.          famotidine 20 MG tablet Commonly known as: PEPCID Take 1  tablet (20 mg total) by mouth 2 (two) times daily.   ketorolac 10 MG tablet Commonly known as: TORADOL Take 1 tablet (10 mg total) by mouth every 8 (eight) hours.   ondansetron 4 MG disintegrating tablet Commonly known as: Zofran ODT Take 1 tablet (4 mg total) by mouth every 8 (eight) hours as needed for nausea or vomiting.   sulfamethoxazole-trimethoprim 800-160 MG tablet Commonly known as: BACTRIM DS Take 1 tablet by mouth every 12 (twelve) hours.   tamsulosin 0.4 MG Caps capsule Commonly known as: FLOMAX Take 1 capsule (0.4 mg total) by mouth daily.        Allergies: No Known Allergies  Family History: No family history on file.  Social History:  reports that he has been smoking cigarettes. He has been smoking an average of 0.50 packs per day. He has never used smokeless tobacco. He reports current alcohol use. He reports that he does not use drugs.  ROS: Pertinent ROS in HPI  Physical Exam: There were no vitals taken for this visit.  Constitutional:  Well nourished. Alert and oriented, No acute distress. HEENT: [place] AT, mask in place.  Trachea midline Cardiovascular: No clubbing, cyanosis, or edema. Respiratory: Normal respiratory effort, no increased work of breathing. Neurologic: Grossly intact, no focal deficits, moving all 4 extremities. Psychiatric: Normal mood and affect.  Laboratory Data: Lab Results  Component Value Date   WBC 11.7 (H) 07/21/2020   HGB 14.2 07/21/2020   HCT 40.0  07/21/2020   MCV 86.2 07/21/2020   PLT 202 07/21/2020    Lab Results  Component Value Date   CREATININE 1.35 (H) 07/21/2020    Lab Results  Component Value Date   AST 84 (H) 07/21/2020   Lab Results  Component Value Date   ALT 58 (H) 07/21/2020    Urinalysis Component     Latest Ref Rng & Units 08/10/2020  Specific Gravity, UA     1.005 - 1.030 1.025  pH, UA     5.0 - 7.5 6.0  Color, UA     Yellow Orange  Appearance Ur     Clear Cloudy (A)  Leukocytes,UA      Negative Negative  Protein,UA     Negative/Trace 1+ (A)  Glucose, UA     Negative Negative  Ketones, UA     Negative Negative  RBC, UA     Negative 3+ (A)  Bilirubin, UA     Negative Negative  Urobilinogen, Ur     0.2 - 1.0 mg/dL 1.0  Nitrite, UA     Negative Negative  Microscopic Examination      See below:   Component     Latest Ref Rng & Units 08/10/2020  WBC, UA     0 - 5 /hpf 6-10 (A)  RBC     0 - 2 /hpf >30 (A)  Epithelial Cells (non renal)     0 - 10 /hpf 0-10  Bacteria, UA     None seen/Few Few  I have reviewed the labs.   Pertinent Imaging: No imaging since last visit   Assessment & Plan:    1. Nephrolithiasis -s/p right URS -follow up RUS pending  2. Dysuria -pain in the right side during urination -may be secondary to stent irritation, debris or UTI -stent was removed by nursing staff in our office -urine culture is pending   No follow-ups on file.  These notes generated with voice recognition software. I apologize for typographical errors.  Michiel Cowboy, PA-C  [facility] [address]  [address] [place], [place] [address] [phone]

## 2020-10-20 ENCOUNTER — Ambulatory Visit: Payer: Self-pay | Admitting: Urology

## 2021-10-07 ENCOUNTER — Emergency Department
Admission: EM | Admit: 2021-10-07 | Discharge: 2021-10-07 | Disposition: A | Discharge status: Home / Self Care | Payer: Self-pay | Attending: Emergency Medicine | Admitting: Emergency Medicine

## 2021-10-07 ENCOUNTER — Other Ambulatory Visit: Payer: Self-pay

## 2021-10-07 DIAGNOSIS — N200 Calculus of kidney: Secondary | ICD-10-CM | POA: Insufficient documentation

## 2021-10-07 LAB — CBC
HCT: 41.6 % (ref 39.0–52.0)
Hemoglobin: 14.5 g/dL (ref 13.0–17.0)
MCH: 31.3 pg (ref 26.0–34.0)
MCHC: 34.9 g/dL (ref 30.0–36.0)
MCV: 89.7 fL (ref 80.0–100.0)
Platelets: 163 10*3/uL (ref 150–400)
RBC: 4.64 MIL/uL (ref 4.22–5.81)
RDW: 13.4 % (ref 11.5–15.5)
WBC: 10.5 10*3/uL (ref 4.0–10.5)
nRBC: 0 % (ref 0.0–0.2)

## 2021-10-07 LAB — BASIC METABOLIC PANEL
Anion gap: 6 (ref 5–15)
BUN: 23 mg/dL — ABNORMAL HIGH (ref 6–20)
CO2: 27 mmol/L (ref 22–32)
Calcium: 9 mg/dL (ref 8.9–10.3)
Chloride: 105 mmol/L (ref 98–111)
Creatinine, Ser: 1.75 mg/dL — ABNORMAL HIGH (ref 0.61–1.24)
GFR, Estimated: 51 mL/min — ABNORMAL LOW (ref >60)
Glucose, Bld: 107 mg/dL — ABNORMAL HIGH (ref 70–99)
Potassium: 3.9 mmol/L (ref 3.5–5.1)
Sodium: 138 mmol/L (ref 135–145)

## 2021-10-07 LAB — URINALYSIS, ROUTINE W REFLEX MICROSCOPIC
Bacteria, UA: NONE SEEN
Bilirubin Urine: NEGATIVE
Glucose, UA: NEGATIVE mg/dL
Ketones, ur: 20 mg/dL — AB
Leukocytes,Ua: NEGATIVE
Nitrite: NEGATIVE
Protein, ur: 100 mg/dL — AB
RBC / HPF: >50 RBC/hpf — ABNORMAL HIGH (ref 0–5)
Specific Gravity, Urine: 1.032 — ABNORMAL HIGH (ref 1.005–1.030)
Squamous Epithelial / HPF: NONE SEEN (ref 0–5)
pH: 6 (ref 5.0–8.0)

## 2021-10-07 MED ORDER — KETOROLAC TROMETHAMINE 10 MG PO TABS: 10.0000 mg | ORAL_TABLET | Freq: Four times a day (QID) | ORAL | 0 refills | Status: DC | PRN

## 2021-10-07 MED ORDER — HYDROMORPHONE HCL 1 MG/ML IJ SOLN
1.0000 mg | Freq: Once | INTRAMUSCULAR | Status: AC
  Administered 2021-10-07: 1 mg via INTRAVENOUS
  Filled 2021-10-07: qty 1

## 2021-10-07 MED ORDER — SODIUM CHLORIDE 0.9 % IV BOLUS
1000.0000 mL | Freq: Once | INTRAVENOUS | Status: AC
  Administered 2021-10-07: 1000 mL via INTRAVENOUS

## 2021-10-07 MED ORDER — TAMSULOSIN HCL 0.4 MG PO CAPS: 0.4000 mg | ORAL_CAPSULE | Freq: Every day | ORAL | 0 refills | Status: DC

## 2021-10-07 MED ORDER — OXYCODONE HCL 5 MG PO TABS: 5.0000 mg | ORAL_TABLET | Freq: Four times a day (QID) | ORAL | 0 refills | Status: AC | PRN

## 2021-10-07 MED ORDER — ONDANSETRON 4 MG PO TBDP: 4.0000 mg | ORAL_TABLET | Freq: Three times a day (TID) | ORAL | 0 refills | Status: AC | PRN

## 2021-10-07 MED ORDER — KETOROLAC TROMETHAMINE 30 MG/ML IJ SOLN
15.0000 mg | Freq: Once | INTRAMUSCULAR | Status: AC
  Administered 2021-10-07: 15 mg via INTRAVENOUS
  Filled 2021-10-07: qty 1

## 2021-10-07 NOTE — Discharge Instructions (Addendum)
You have a kidney stone. See report below.   Try to be cautious on the ibuprofen due to elevated kidney function  occasional 400mg  1-2 x should be okay if you are hydrating well Take tylenol 1g every 8 hours daily. Take oxycodone for breakthrough pain. Do not drive, work, or operate machinery while on this.  Take zofran to help with nausea. Take Flomax to help dilate ureter. Call urology number above to schedule outpatient appointment. Return to ED for fevers, unable to keep food down, or any other concerns.

## 2021-10-07 NOTE — ED Provider Notes (Signed)
-----------------------------------------   5:18 PM on 10/07/2021 ----------------------------------------- Patient states that Toradol has relieved his pain no longer has pain.  States the other medication is not really doing anything for him but Toradol seems to work really well.  Patient's renal function has decreased likely due to the kidney stones.  We will prescribe a very short course of Toradol I told the patient he could only take it once per day, otherwise he will have pain medication to take he will follow-up with urology on Monday for hopeful lithotripsy Thursday.  I discussed my typical kidney stone return precautions.  Patient agreeable to plan of care.   Minna Antis, MD 10/07/21 1718

## 2021-10-07 NOTE — ED Triage Notes (Signed)
Pt with hx of kidney stones and pt found out he has 7 mm kidney stone on left side while on vacation this past Wednesday. Pt with left flank pain. Pt states he has been on an antibiotic, pain and nausea meds. Pt states Toradol works well for his pain. Pt states pain is increasing.

## 2021-10-07 NOTE — ED Provider Notes (Signed)
[facility] Provider Note    Event Date/Time   First MD Initiated Contact with Patient 10/07/21 1449     (approximate)   History   Flank Pain   HPI  Tyler Hughes is a 36 y.o. male who was seen out of town and brought the records and had a CT scan that did confirm 7 mm stone on the left side that was partially obstructed.  Patient sent home on Cipro, hydrocodone 7.5, Zofran, Toradol he reports almost being out of the hydrocodone and the Toradol which is why he came into the ER due to his pain.  Denies any fevers.  He reports having prior kidney stones that he had to pass in the past.  Reports some decreased p.o. intake just secondary to the discomfort but no active vomiting.   Physical Exam   Triage Vital Signs: ED Triage Vitals  Enc Vitals Group     BP 10/07/21 1341 (!) 148/95     Pulse Rate 10/07/21 1341 67     Resp 10/07/21 1341 20     Temp 10/07/21 1341 97.9 F (36.6 C)     Temp Source 10/07/21 1341 Oral     SpO2 10/07/21 1341 98 %     Weight 10/07/21 1343 165 lb (74.8 kg)     Height 10/07/21 1343 5\' 7"  (1.702 m)     Head Circumference --      Peak Flow --      Pain Score 10/07/21 1343 5     Pain Loc --      Pain Edu? --      Excl. in GC? --     Most recent vital signs: Vitals:   10/07/21 1341  BP: (!) 148/95  Pulse: 67  Resp: 20  Temp: 97.9 F (36.6 C)  SpO2: 98%     General: Awake, no distress.  CV:  Good peripheral perfusion.  Resp:  Normal effort.  Abd:  No distention.  Other:  Patient reports some left pain but seems pretty comfortable   ED Results / Procedures / Treatments   Labs (all labs ordered are listed, but only abnormal results are displayed) Labs Reviewed  URINALYSIS, ROUTINE W REFLEX MICROSCOPIC - Abnormal; Notable for the following components:      Result Value   Color, Urine YELLOW (*)    APPearance HAZY (*)    Specific Gravity, Urine 1.032 (*)    Hgb urine dipstick LARGE (*)    Ketones, ur 20 (*)     Protein, ur 100 (*)    RBC / HPF >50 (*)    All other components within normal limits  BASIC METABOLIC PANEL - Abnormal; Notable for the following components:   Glucose, Bld 107 (*)    BUN 23 (*)    Creatinine, Ser 1.75 (*)    GFR, Estimated 51 (*)    All other components within normal limits  CBC     PROCEDURES:  Critical Care performed: No  Procedures   MEDICATIONS ORDERED IN ED: Medications  HYDROmorphone (DILAUDID) injection 1 mg (has no administration in time range)  sodium chloride 0.9 % bolus 1,000 mL (has no administration in time range)     IMPRESSION / MDM / ASSESSMENT AND PLAN / ED COURSE  I reviewed the triage vital signs and the nursing notes.   Patient's presentation is most consistent with kidney stone.  The patient was likely having pain secondary to kidney stone and almost out of his  medications.  Labs ordered evaluate for any [place] , AKI.  Patient has no fever and urine without evidence of UTI to suggest infected kidney stone.  CBC normal.  UA with RBCs but no significant bacteria and nitrite leuk negative so unlikely UTI, creatinine is elevated at 1.75 does have a little bit of prior elevation so patient to be some IV fluids.  We will give patient dose of IV Dilaudid.  Pain is well controlled suspect patient could be discharged home.  We discussed oxycodone, Tylenol ibuprofen occasionally but is trying to limited secondary to his elevated creatinine as well as Flomax.  I have messaged Dr. Lonna Cobb to try to get patient close follow-up.  Patient expressed understanding felt comfortable with this plan  Patient had off to oncoming team pending reassessment after fluids and pain meds.   FINAL CLINICAL IMPRESSION(S) / ED DIAGNOSES   Final diagnoses:  Kidney stone     Rx / DC Orders   ED Discharge Orders          Ordered    oxyCODONE (ROXICODONE) 5 MG immediate release tablet  Every 6 hours PRN        10/07/21 1455    tamsulosin (FLOMAX) 0.4 MG  CAPS capsule  Daily        10/07/21 1455    ondansetron (ZOFRAN-ODT) 4 MG disintegrating tablet  Every 8 hours PRN        10/07/21 1455             Note:  This document was prepared using Dragon voice recognition software and may include unintentional dictation errors.   Concha Se, MD 10/07/21 1455

## 2021-10-09 LAB — URINE CULTURE: Culture: NO GROWTH

## 2021-10-10 ENCOUNTER — Other Ambulatory Visit: Payer: Self-pay | Admitting: Urology

## 2021-10-10 ENCOUNTER — Ambulatory Visit
Admission: RE | Admit: 2021-10-10 | Discharge: 2021-10-10 | Disposition: A | Discharge status: Home / Self Care | Payer: Self-pay | Attending: Urology | Admitting: Urology

## 2021-10-10 ENCOUNTER — Encounter: Payer: Self-pay | Admitting: Urology

## 2021-10-10 ENCOUNTER — Other Ambulatory Visit: Payer: Self-pay | Admitting: *Deleted

## 2021-10-10 ENCOUNTER — Ambulatory Visit: Payer: Self-pay | Admitting: Urology

## 2021-10-10 ENCOUNTER — Ambulatory Visit
Admission: RE | Admit: 2021-10-10 | Discharge: 2021-10-10 | Disposition: A | Discharge status: Home / Self Care | Payer: Self-pay | Source: Ambulatory Visit | Attending: Urology | Admitting: Urology

## 2021-10-10 VITALS — BP 146/92 | HR 73 | Ht 66.0 in | Wt 177.0 lb

## 2021-10-10 DIAGNOSIS — N200 Calculus of kidney: Secondary | ICD-10-CM

## 2021-10-10 DIAGNOSIS — N201 Calculus of ureter: Secondary | ICD-10-CM

## 2021-10-10 NOTE — Progress Notes (Signed)
   10/10/2021 9:31 AM   Tyler Hughes 1985/10/22 [id number]  Reason for visit: Left UPJ stone  HPI: 36 year old male who I previously saw in March 2022 for right-sided flank pain secondary to a 7 mm right mid ureteral stone with ongoing renal colic, and underwent right ureteroscopy, laser lithotripsy, and stent placement.  The ureter was very tight at that time, and he had significant discomfort with stent removal on [place].  He reports 2 weeks of left-sided flank pain, and he was seen in the ER in [place] with CT showing a 7 mm left UPJ stone with hydronephrosis.  He has had ongoing left-sided flank pain.  Urine culture 6/10 showed no growth.  I personally reviewed and interpreted his KUB today that shows a 7 mm UPJ stone clearly seen.  I am unable to personally review the CT disc images at our [place] location today, but will be able to pull them up in [place] tomorrow.  We discussed various treatment options for urolithiasis including observation with or without medical expulsive therapy, shockwave lithotripsy (SWL), ureteroscopy and laser lithotripsy with stent placement, and percutaneous nephrolithotomy.  We discussed that management is based on stone size, location, density, patient co-morbidities, and patient preference.   Stones <91mm in size have a >80% spontaneous passage rate. Data surrounding the use of tamsulosin for medical expulsive therapy is controversial, but meta analyses suggests it is most efficacious for distal stones between 5-36mm in size. Possible side effects include dizziness/lightheadedness, and retrograde ejaculation.  SWL has a lower stone free rate in a single procedure, but also a lower complication rate compared to ureteroscopy and avoids a stent and associated stent related symptoms. Possible complications include renal hematoma, steinstrasse, and need for additional treatment.  Ureteroscopy with laser lithotripsy and stent placement has a higher stone  free rate than SWL in a single procedure, however increased complication rate including possible infection, ureteral injury, bleeding, and stent related morbidity. Common stent related symptoms include dysuria, urgency/frequency, and flank pain.  After an extensive discussion of the risks and benefits of the above treatment options, the patient would like to proceed with left shockwave lithotripsy this Thursday.   Sondra Come, MD  [facility] [address] [place], [place] [address] [phone]

## 2021-10-10 NOTE — Progress Notes (Signed)
ESWL ORDER FORM  Expected date of procedure: 10/12/2021  Surgeon: Nickolas Madrid, MD  Post op standing: 2-4wk follow up w/KUB prior with PA  Anticoagulation/Aspirin/NSAID standing order: Hold all 72 hours prior  Anesthesia standing order: MAC  VTE standing: SCD's  Dx: Left Ureteral Stone  Procedure: left Extracorporeal shock wave lithotripsy  CPT : 82505  Standing Order Set:   *NPO after mn, KUB  *NS 16m/hr, Keflex 5039mPO, Benadryl 2522mO, Valium 43m62m, Zofran 4mg 68m   Medications if other than standing orders:   NONE

## 2021-10-10 NOTE — Patient Instructions (Signed)
Lithotripsy  Lithotripsy is a treatment that can help break up kidney stones that are too large to pass on their own. This is a nonsurgical procedure that crushes a kidney stone with shock waves. These shock waves pass through your body and focus on the kidney stone. They cause the kidney stone to break up into smaller pieces while it is still in the urinary tract. The smaller pieces of stone can pass more easily out of your body in the urine. Tell a health care provider about: Any allergies you have. All medicines you are taking, including vitamins, herbs, eye drops, creams, and over-the-counter medicines. Any problems you or family members have had with anesthetic medicines. Any blood disorders you have. Any surgeries you have had. Any medical conditions you have. Whether you are pregnant or may be pregnant. What are the risks? Generally, this is a safe procedure. However, problems may occur, including: Infection. Bleeding from the kidney. Bruising of the kidney or skin. Scarring of the kidney, which can lead to: Increased blood pressure. Poor kidney function. Return (recurrence) of kidney stones. Damage to other structures or organs, such as the liver, colon, spleen, or pancreas. Blockage (obstruction) of the tube that carries urine from the kidney to the bladder (ureter). Failure of the kidney stone to break into pieces (fragments). What happens before the procedure? Staying hydrated Follow instructions from your health care provider about hydration, which may include: Up to 2 hours before the procedure - you may continue to drink clear liquids, such as water, clear fruit juice, black coffee, and plain tea. Eating and drinking restrictions Follow instructions from your health care provider about eating and drinking, which may include: 8 hours before the procedure - stop eating heavy meals or foods, such as meat, fried foods, or fatty foods. 6 hours before the procedure - stop eating  light meals or foods, such as toast or cereal. 6 hours before the procedure - stop drinking milk or drinks that contain milk. 2 hours before the procedure - stop drinking clear liquids. Medicines Ask your health care provider about: Changing or stopping your regular medicines. This is especially important if you are taking diabetes medicines or blood thinners. Taking medicines such as aspirin and ibuprofen. These medicines can thin your blood. Do not take these medicines unless your health care provider tells you to take them. Taking over-the-counter medicines, vitamins, herbs, and supplements. Tests You may have tests, such as: Blood tests. Urine tests. Imaging tests, such as a CT scan. General instructions Plan to have someone take you home from the hospital or clinic. If you will be going home right after the procedure, plan to have someone with you for 24 hours. Ask your health care provider what steps will be taken to help prevent infection. These may include washing skin with a germ-killing soap. What happens during the procedure?  An IV will be inserted into one of your veins. You will be given one or more of the following: A medicine to help you relax (sedative). A medicine to make you fall asleep (general anesthetic). A water-filled cushion may be placed behind your kidney or on your abdomen. In some cases, you may be placed in a tub of lukewarm water. Your body will be positioned in a way that makes it easy to target the kidney stone. An X-ray or ultrasound exam will be done to locate your stone. Shock waves will be aimed at the stone. If you are awake, you may feel a tapping sensation   as the shock waves pass through your body. A flexible tube with holes in it (stent) may be placed in the ureter. This will help keep urine flowing from the kidney if the fragments of the stone have been blocking the ureter. The procedure may vary among health care providers and hospitals. What  happens after the procedure? You may have an X-ray to see whether the procedure was able to break up the kidney stone and how much of the stone has passed. If large stone fragments remain after treatment, you may need to have a second procedure at a later time. Your blood pressure, heart rate, breathing rate, and blood oxygen level will be monitored until you leave the hospital or clinic. You may be given antibiotics or pain medicine as needed. If a stent was placed in your ureter during surgery, it may stay in place for a few weeks. You may need to strain your urine to collect pieces of the kidney stone for testing. You will need to drink plenty of water. If you were given a sedative during the procedure, it can affect you for several hours. Do not drive or operate machinery until your health care provider says that it is safe. Summary Lithotripsy is a treatment that can help break up kidney stones that are too large to pass on their own. Lithotripsy is a nonsurgical procedure that crushes a kidney stone with shock waves. Generally, this is a safe procedure. However, problems may occur, including damage to the kidney or other organs, infection, or obstruction of the tube that carries urine from the kidney to the bladder (ureter). You may have a stent placed in your ureter to help drain your urine. This stent may stay in place for a few weeks. After the procedure, you will need to drink plenty of water. You may be asked to strain your urine to collect pieces of the kidney stone for testing. This information is not intended to replace advice given to you by your health care provider. Make sure you discuss any questions you have with your health care provider. Document Revised: 03/13/2021 Document Reviewed: 12/19/2020 Elsevier Patient Education  2023 Elsevier Inc.  

## 2021-10-12 ENCOUNTER — Ambulatory Visit: Payer: Self-pay

## 2021-10-12 ENCOUNTER — Encounter: Payer: Self-pay | Admitting: Urology

## 2021-10-12 ENCOUNTER — Encounter
Admission: RE | Disposition: A | Discharge status: Home / Self Care | Payer: Self-pay | Source: Home / Self Care | Attending: Urology

## 2021-10-12 ENCOUNTER — Other Ambulatory Visit: Payer: Self-pay

## 2021-10-12 ENCOUNTER — Ambulatory Visit
Admission: RE | Admit: 2021-10-12 | Discharge: 2021-10-12 | Disposition: A | Discharge status: Home / Self Care | Payer: Self-pay | Attending: Urology | Admitting: Urology

## 2021-10-12 DIAGNOSIS — N201 Calculus of ureter: Secondary | ICD-10-CM | POA: Insufficient documentation

## 2021-10-12 HISTORY — PX: EXTRACORPOREAL SHOCK WAVE LITHOTRIPSY: SHX1557

## 2021-10-12 SURGERY — LITHOTRIPSY, ESWL
Anesthesia: Moderate Sedation | Laterality: Left

## 2021-10-12 MED ORDER — DIPHENHYDRAMINE HCL 25 MG PO CAPS
ORAL_CAPSULE | ORAL | Status: AC
  Administered 2021-10-12: 25 mg via ORAL
  Filled 2021-10-12: qty 1

## 2021-10-12 MED ORDER — CEPHALEXIN 500 MG PO CAPS
ORAL_CAPSULE | ORAL | Status: AC
  Administered 2021-10-12: 500 mg via ORAL
  Filled 2021-10-12: qty 1

## 2021-10-12 MED ORDER — SODIUM CHLORIDE 0.9 % IV SOLN: INTRAVENOUS | Status: DC

## 2021-10-12 MED ORDER — CEPHALEXIN 500 MG PO CAPS: 500.0000 mg | ORAL_CAPSULE | Freq: Once | ORAL | Status: AC

## 2021-10-12 MED ORDER — ONDANSETRON HCL 4 MG/2ML IJ SOLN: 4.0000 mg | Freq: Once | INTRAMUSCULAR | Status: AC

## 2021-10-12 MED ORDER — OXYCODONE HCL 5 MG PO TABS: 5.0000 mg | ORAL_TABLET | Freq: Four times a day (QID) | ORAL | 0 refills | Status: DC | PRN

## 2021-10-12 MED ORDER — DIPHENHYDRAMINE HCL 25 MG PO CAPS: 25.0000 mg | ORAL_CAPSULE | ORAL | Status: AC

## 2021-10-12 MED ORDER — ONDANSETRON HCL 4 MG/2ML IJ SOLN
INTRAMUSCULAR | Status: AC
  Administered 2021-10-12: 4 mg via INTRAVENOUS
  Filled 2021-10-12: qty 2

## 2021-10-12 MED ORDER — DIAZEPAM 5 MG PO TABS: 10.0000 mg | ORAL_TABLET | ORAL | Status: AC

## 2021-10-12 MED ORDER — DIAZEPAM 5 MG PO TABS
ORAL_TABLET | ORAL | Status: AC
  Administered 2021-10-12: 10 mg via ORAL
  Filled 2021-10-12: qty 2

## 2021-10-12 NOTE — Brief Op Note (Signed)
10/12/2021  8:05 AM  PATIENT:  Tyler Hughes  36 y.o. male  PRE-OPERATIVE DIAGNOSIS: 43mm Left proximal Ureteral Stone  POST-OPERATIVE DIAGNOSIS:  Same  PROCEDURE:  Procedure(s): EXTRACORPOREAL SHOCK WAVE LITHOTRIPSY (ESWL) (Left)  SURGEON:  Surgeon(s) and Role:    * Tilton Marsalis, Laurette Schimke, MD - Primary  ANESTHESIA: Conscious Sedation  EBL:  None  Drains: None  Specimen: None  Findings:  Tolerated SWL very well, stone smudged  DISPO: Flomax, pain meds PRN, RTC 2 weeks KUB  Legrand Rams, MD 10/12/2021

## 2021-10-12 NOTE — H&P (Signed)
   10/12/21 7:36 AM   Tyler Hughes [birth year] [id number]  CC: Left proximal ureteral stone  HPI: 36 year old male with history of nephrolithiasis, has not tolerated stent well previously. Presents with one week left sided flank pain, CT showing 61mm left proximal ureteral stone. He opted for SWL.  Surgical History: Past Surgical History:  Procedure Laterality Date   CHOLECYSTECTOMY     CYSTOSCOPY/URETEROSCOPY/HOLMIUM LASER/STENT PLACEMENT Right 07/22/2020   Procedure: CYSTOSCOPY/URETEROSCOPY/HOLMIUM LASER/STENT PLACEMENT;  Surgeon: Sondra Come, MD;  Location: [facility] ORS;  Service: Urology;  Laterality: Right;   FACIAL FRACTURE SURGERY     FEMUR SURGERY     OPEN REDUCTION INTERNAL FIXATION (ORIF) DISTAL RADIAL FRACTURE Right 02/24/2015   Procedure: OPEN REDUCTION INTERNAL FIXATION (ORIF) DISTAL RADIAL FRACTURE;  Surgeon: Kennedy Bucker, MD;  Location: [facility] ORS;  Service: Orthopedics;  Laterality: Right;   TONSILLECTOMY       Family History: History reviewed. No pertinent family history.  Social History:  reports that he has quit smoking. His smoking use included cigarettes. He smoked an average of .5 packs per day. He has been exposed to tobacco smoke. He has never used smokeless tobacco. He reports current alcohol use. He reports that he does not use drugs.  Physical Exam: BP (!) 139/97   Pulse 60   Temp 97.9 F (36.6 C) (Temporal)   Resp 16   Ht 5\' 6"  (1.676 m)   Wt 80.3 kg   SpO2 99%   BMI 28.57 kg/m    Constitutional:  Alert and oriented, No acute distress. Cardiovascular: RRR Respiratory: CTA bilaterally GI: Abdomen is soft, nontender, nondistended, no abdominal masses   Laboratory Data: Culture no growth  Pertinent Imaging: I have personally viewed and interpreted the CT and KUB, 37mm left proximal ureteral stone, small spherical object overlying kidney is likely a BB based on subcutaneous location on CT.  Assessment & Plan:   36 yo M with non-infected  60mm left proximal ureteral stone, opted for SWL.  We discussed various treatment options for urolithiasis including observation with or without medical expulsive therapy, shockwave lithotripsy (SWL), ureteroscopy and laser lithotripsy with stent placement.  We discussed that management is based on stone size, location, density, patient co-morbidities, and patient preference.    SWL has a lower stone free rate in a single procedure, but also a lower complication rate compared to ureteroscopy and avoids a stent and associated stent related symptoms. Possible complications include renal hematoma, steinstrasse, and need for additional treatment.  LEFT SWL today   11m, MD 10/12/2021  [facility] [address], [address] [place], [place] [id number]

## 2021-10-12 NOTE — Discharge Instructions (Signed)

## 2021-10-13 ENCOUNTER — Encounter: Payer: Self-pay | Admitting: Urology

## 2021-10-17 ENCOUNTER — Ambulatory Visit
Admission: RE | Admit: 2021-10-17 | Discharge: 2021-10-17 | Disposition: A | Discharge status: Home / Self Care | Payer: Self-pay | Source: Ambulatory Visit | Attending: Urology | Admitting: Urology

## 2021-10-17 ENCOUNTER — Other Ambulatory Visit: Payer: Self-pay

## 2021-10-17 ENCOUNTER — Ambulatory Visit
Admission: RE | Admit: 2021-10-17 | Discharge: 2021-10-17 | Disposition: A | Discharge status: Home / Self Care | Payer: Self-pay | Attending: Urology | Admitting: Urology

## 2021-10-17 ENCOUNTER — Encounter: Payer: Self-pay | Admitting: Urology

## 2021-10-17 ENCOUNTER — Ambulatory Visit (INDEPENDENT_AMBULATORY_CARE_PROVIDER_SITE_OTHER): Payer: Self-pay | Admitting: Urology

## 2021-10-17 ENCOUNTER — Telehealth: Payer: Self-pay

## 2021-10-17 VITALS — BP 132/83 | HR 56 | Ht 67.0 in | Wt 172.0 lb

## 2021-10-17 DIAGNOSIS — N201 Calculus of ureter: Secondary | ICD-10-CM

## 2021-10-17 DIAGNOSIS — R3129 Other microscopic hematuria: Secondary | ICD-10-CM

## 2021-10-17 DIAGNOSIS — R39198 Other difficulties with micturition: Secondary | ICD-10-CM

## 2021-10-17 LAB — URINALYSIS, COMPLETE
Bilirubin, UA: NEGATIVE
Glucose, UA: NEGATIVE
Leukocytes,UA: NEGATIVE
Nitrite, UA: NEGATIVE
Specific Gravity, UA: 1.03 (ref 1.005–1.030)
Urobilinogen, Ur: 0.2 mg/dL (ref 0.2–1.0)
pH, UA: 6 (ref 5.0–7.5)

## 2021-10-17 LAB — MICROSCOPIC EXAMINATION: RBC, Urine: >30 /hpf — AB (ref 0–2)

## 2021-10-17 MED ORDER — TAMSULOSIN HCL 0.4 MG PO CAPS: 0.4000 mg | ORAL_CAPSULE | Freq: Every day | ORAL | 0 refills | Status: DC

## 2021-10-17 NOTE — Progress Notes (Signed)
10/17/2021 11:38 AM   Tyler Hughes [birth year] [id number]  Referring provider: [facility] [address] [place],  [place] [address]  Urological history: 1.  Nephrolithiasis -Right URS for 7 mm mid right ureteral stone, March 2022  Chief Complaint  Patient presents with   Urinary Retention   Nephrolithiasis   HPI: Tyler Hughes is a 36 y.o. male who presents today for difficulty urinating after ESWL yesterday.  He underwent ESWL yesterday for 7 mm left proximal ureteral stone.  He has been having urinary dribbling since yesterday and he was concerned that this may be the symptoms of a UTI.  Patient denies any modifying or aggravating factors.  Patient denies any gross hematuria, dysuria or suprapubic/flank pain.  Patient denies any fevers, chills, nausea or vomiting.    UA greater than 30 RBCs and a few bacteria/yeast  KUB 2 mm calcification to the left of the sacral alae  PMH: No past medical history on file.  Surgical History: Past Surgical History:  Procedure Laterality Date   CHOLECYSTECTOMY     CYSTOSCOPY/URETEROSCOPY/HOLMIUM LASER/STENT PLACEMENT Right 07/22/2020   Procedure: CYSTOSCOPY/URETEROSCOPY/HOLMIUM LASER/STENT PLACEMENT;  Surgeon: Sondra Come, MD;  Location: [facility];  Service: Urology;  Laterality: Right;   EXTRACORPOREAL SHOCK WAVE LITHOTRIPSY Left 10/12/2021   Procedure: EXTRACORPOREAL SHOCK WAVE LITHOTRIPSY (ESWL);  Surgeon: Sondra Come, MD;  Location: [facility] OR[facility];  Service: Urology;  Laterality: Left;   FACIAL FRACTURE SURGERY     FEMUR SURGERY     OPEN REDUCTION INTERNAL FIXATION (ORIF) DISTAL RADIAL FRACTURE Right 02/24/2015   Procedure: OPEN REDUCTION INTERNAL FIXATION (ORIF) DISTAL RADIAL FRACTURE;  Surgeon: Kennedy Bucker, MD;  Location: [facility];  Service: Orthopedics;  Laterality: Right;   TONSILLECTOMY      Home Medications:  Allergies as of 10/17/2021   No Known Allergies      Medication List         Accurate as of October 17, 2021 11:38 AM. If you have any questions, ask your nurse or doctor.          ketorolac 10 MG tablet Commonly known as: TORADOL Take 1 tablet (10 mg total) by mouth every 6 (six) hours as needed.   oxyCODONE 5 MG immediate release tablet Commonly known as: Roxicodone Take 1 tablet (5 mg total) by mouth every 6 (six) hours as needed for severe pain.   tamsulosin 0.4 MG Caps capsule Commonly known as: FLOMAX Take 1 capsule (0.4 mg total) by mouth daily.        Allergies: No Known Allergies  Family History: No family history on file.  Social History:  reports that he has quit smoking. His smoking use included cigarettes. He smoked an average of .5 packs per day. He has been exposed to tobacco smoke. He has never used smokeless tobacco. He reports current alcohol use. He reports that he does not use drugs.  ROS: Pertinent ROS in HPI  Physical Exam: BP 132/83   Pulse (!) 56   Ht 5\' 7"  (1.702 m)   Wt 172 lb (78 kg)   BMI 26.94 kg/m   Constitutional:  Well nourished. Alert and oriented, No acute distress. HEENT: [place] AT, moist mucus membranes.  Trachea midline Cardiovascular: No clubbing, cyanosis, or edema. Respiratory: Normal respiratory effort, no increased work of breathing. Neurologic: Grossly intact, no focal deficits, moving all 4 extremities. Psychiatric: Normal mood and affect.  Laboratory Data: Lab Results  Component Value Date   WBC  10.5 10/07/2021   HGB 14.5 10/07/2021   HCT 41.6 10/07/2021   MCV 89.7 10/07/2021   PLT 163 10/07/2021    Lab Results  Component Value Date   CREATININE 1.75 (H) 10/07/2021   Urinalysis Micro heme as expected with passage of stones.  See epic. I have reviewed the labs.   Pertinent Imaging: 2 mm distal left ureteral stone I have independently reviewed the films.  See HPI.  Radiology interpretation still pending.   Assessment & Plan:    1. Difficulty urinating -UA no sign of  infection -Secondary to a distal left ureteral stone  2. Left ureteral stone -Continue tamsulosin 0.4 mg refill given  3.  Microscopic hematuria -We will continue to monitor after the passage of stones to ensure it clears   Return for Keep follow-up with Sam in July.  These notes generated with voice recognition software. I apologize for typographical errors.  Michiel Cowboy, PA-C  [facility] [address]  [address] [place], [place] [address] [phone]

## 2021-10-17 NOTE — Telephone Encounter (Signed)
Incoming call on triage line from pt stating he is having difficulty urinating s/p ESWL. He states he isnt in pain right now but he was yesterday and took the remaining toradol he had and he said it helped. He denies f/c/n/v. Per Carollee Herter, add pt for today, KUB prior. Pt confirmed.

## 2021-11-02 ENCOUNTER — Ambulatory Visit
Admission: RE | Admit: 2021-11-02 | Discharge: 2021-11-02 | Disposition: A | Discharge status: Home / Self Care | Payer: Self-pay | Attending: Urology | Admitting: Urology

## 2021-11-02 ENCOUNTER — Ambulatory Visit
Admission: RE | Admit: 2021-11-02 | Discharge: 2021-11-02 | Disposition: A | Discharge status: Home / Self Care | Payer: Self-pay | Source: Ambulatory Visit | Attending: Urology | Admitting: Urology

## 2021-11-02 ENCOUNTER — Ambulatory Visit (INDEPENDENT_AMBULATORY_CARE_PROVIDER_SITE_OTHER): Payer: Self-pay | Admitting: Physician Assistant

## 2021-11-02 VITALS — BP 126/85 | HR 53

## 2021-11-02 DIAGNOSIS — N201 Calculus of ureter: Secondary | ICD-10-CM

## 2021-11-02 NOTE — Progress Notes (Signed)
11/02/2021 12:35 PM   Tyler Hughes January 28, 1986 [id number]  CC: Chief Complaint  Patient presents with   Nephrolithiasis   HPI: Tyler Hughes is a 36 y.o. male with PMH nephrolithiasis who underwent ESWL with Dr. Richardo Hanks on 10/12/2021 for management of a 7 mm proximal left ureteral stone who presents today for postop follow-up.  Operative note describes smudging of the stone.  He was seen in clinic by Michiel Cowboy 5 days later with reports of difficulty urinating.  KUB at that time showed a persistent 2 mm stone fragment at the left UVJ.  Today he reports he passed several additional fragments since he was last seen in clinic.  His pain has resolved and he has no acute concerns.  He captured some fragments, but has misplaced them at his home.  He states that he stays extremely well-hydrated, primarily with water.  He will also drink beer but generally avoids other beverages.  KUB today with interval resolution of the left UVJ fragment.  PMH: No past medical history on file.  Surgical History: Past Surgical History:  Procedure Laterality Date   CHOLECYSTECTOMY     CYSTOSCOPY/URETEROSCOPY/HOLMIUM LASER/STENT PLACEMENT Right 07/22/2020   Procedure: CYSTOSCOPY/URETEROSCOPY/HOLMIUM LASER/STENT PLACEMENT;  Surgeon: Sondra Come, MD;  Location: [facility];  Service: Urology;  Laterality: Right;   EXTRACORPOREAL SHOCK WAVE LITHOTRIPSY Left 10/12/2021   Procedure: EXTRACORPOREAL SHOCK WAVE LITHOTRIPSY (ESWL);  Surgeon: Sondra Come, MD;  Location: [facility];  Service: Urology;  Laterality: Left;   FACIAL FRACTURE SURGERY     FEMUR SURGERY     OPEN REDUCTION INTERNAL FIXATION (ORIF) DISTAL RADIAL FRACTURE Right 02/24/2015   Procedure: OPEN REDUCTION INTERNAL FIXATION (ORIF) DISTAL RADIAL FRACTURE;  Surgeon: Kennedy Bucker, MD;  Location: [facility];  Service: Orthopedics;  Laterality: Right;   TONSILLECTOMY      Home Medications:  Allergies as of 11/02/2021   No Known  Allergies      Medication List        Accurate as of November 02, 2021 12:35 PM. If you have any questions, ask your nurse or doctor.          STOP taking these medications    ketorolac 10 MG tablet Commonly known as: TORADOL   oxyCODONE 5 MG immediate release tablet Commonly known as: Roxicodone   tamsulosin 0.4 MG Caps capsule Commonly known as: FLOMAX        Allergies:  No Known Allergies  Family History: No family history on file.  Social History:   reports that he has quit smoking. His smoking use included cigarettes. He smoked an average of .5 packs per day. He has been exposed to tobacco smoke. He has never used smokeless tobacco. He reports current alcohol use. He reports that he does not use drugs.  Physical Exam: BP 126/85   Pulse (!) 53   Constitutional:  Alert and oriented, no acute distress, nontoxic appearing HEENT: [place], AT Cardiovascular: No clubbing, cyanosis, or edema Respiratory: Normal respiratory effort, no increased work of breathing Skin: No rashes, bruises or suspicious lesions Neurologic: Grossly intact, no focal deficits, moving all 4 extremities Psychiatric: Normal mood and affect  Pertinent Imaging: KUB, 11/02/2021: CLINICAL DATA:  Status post ESWL of a left ureteral stone 3 weeks ago.   EXAM: ABDOMEN - 1 VIEW   COMPARISON:  October 17, 2021   FINDINGS: The bowel gas pattern is normal. Radiopaque surgical clips are seen within the right upper quadrant. A 2 mm soft tissue calcification is  again seen overlying the expected region of the left kidney.   IMPRESSION: 1. 2 mm renal calculus overlying the left kidney. 2. Status post cholecystectomy.     Electronically Signed   By: Aram Candela M.D.   On: 11/02/2021 21:25  I personally reviewed the images referenced above and note interval resolution of the left UVJ.  Assessment & Plan:   1. Left ureteral stone Cleared on KUB, symptoms resolved.  I asked him to drop off his  stone fragments when he relocates them so that we may send them for analysis.    We discussed stone prevention recommendations including adequate hydration, decreased oxalate intake, moderate calcium intake, decrease sodium intake, and increased citrate intake.  He declined metabolic work-up at this time, may consider this in the future with recurrent stone episodes. - Calculi, with Photograph (to Clinical Lab); Future   Return in about 1 year (around 11/03/2022) for Annual stone visit with KUB prior.  Carman Ching, PA-C  [facility] [address], [address] [place], [place] [address] [phone]

## 2022-11-08 ENCOUNTER — Ambulatory Visit: Payer: Self-pay | Admitting: Physician Assistant

## 2022-11-26 ENCOUNTER — Other Ambulatory Visit: Payer: Self-pay

## 2022-11-26 DIAGNOSIS — N200 Calculus of kidney: Secondary | ICD-10-CM

## 2022-11-27 ENCOUNTER — Ambulatory Visit: Payer: Self-pay | Admitting: Physician Assistant

## 2022-11-27 ENCOUNTER — Encounter: Payer: Self-pay | Admitting: Physician Assistant

## 2023-03-14 ENCOUNTER — Emergency Department (HOSPITAL_COMMUNITY)
Admission: EM | Admit: 2023-03-14 | Discharge: 2023-03-15 | Disposition: A | Discharge status: Home / Self Care | Payer: No Typology Code available for payment source | Attending: Emergency Medicine | Admitting: Emergency Medicine

## 2023-03-14 ENCOUNTER — Emergency Department (HOSPITAL_COMMUNITY): Payer: No Typology Code available for payment source

## 2023-03-14 ENCOUNTER — Encounter (HOSPITAL_COMMUNITY): Payer: Self-pay

## 2023-03-14 ENCOUNTER — Other Ambulatory Visit: Payer: Self-pay

## 2023-03-14 DIAGNOSIS — Z23 Encounter for immunization: Secondary | ICD-10-CM | POA: Insufficient documentation

## 2023-03-14 DIAGNOSIS — S0990XA Unspecified injury of head, initial encounter: Secondary | ICD-10-CM | POA: Insufficient documentation

## 2023-03-14 DIAGNOSIS — S01111A Laceration without foreign body of right eyelid and periocular area, initial encounter: Secondary | ICD-10-CM | POA: Diagnosis not present

## 2023-03-14 DIAGNOSIS — S0992XA Unspecified injury of nose, initial encounter: Secondary | ICD-10-CM | POA: Diagnosis present

## 2023-03-14 DIAGNOSIS — S0121XA Laceration without foreign body of nose, initial encounter: Secondary | ICD-10-CM | POA: Diagnosis not present

## 2023-03-14 DIAGNOSIS — Y92410 Unspecified street and highway as the place of occurrence of the external cause: Secondary | ICD-10-CM | POA: Insufficient documentation

## 2023-03-14 MED ORDER — TETANUS-DIPHTH-ACELL PERTUSSIS 5-2.5-18.5 LF-MCG/0.5 IM SUSY
0.5000 mL | PREFILLED_SYRINGE | Freq: Once | INTRAMUSCULAR | Status: AC
  Administered 2023-03-14: 0.5 mL via INTRAMUSCULAR
  Filled 2023-03-14: qty 0.5

## 2023-03-14 MED ORDER — HYDROCODONE-ACETAMINOPHEN 5-325 MG PO TABS: 1.0000 | ORAL_TABLET | ORAL | 0 refills | Status: AC | PRN

## 2023-03-14 MED ORDER — CEPHALEXIN 250 MG PO CAPS
500.0000 mg | ORAL_CAPSULE | Freq: Once | ORAL | Status: AC
  Administered 2023-03-14: 500 mg via ORAL
  Filled 2023-03-14: qty 2

## 2023-03-14 MED ORDER — HYDROCODONE-ACETAMINOPHEN 5-325 MG PO TABS
1.0000 | ORAL_TABLET | Freq: Once | ORAL | Status: AC
  Administered 2023-03-14: 1 via ORAL
  Filled 2023-03-14: qty 1

## 2023-03-14 MED ORDER — LORAZEPAM 1 MG PO TABS
1.0000 mg | ORAL_TABLET | Freq: Once | ORAL | Status: AC
Start: 2023-03-14 — End: 2023-03-14
  Administered 2023-03-14: 1 mg via ORAL
  Filled 2023-03-14: qty 1

## 2023-03-14 MED ORDER — CEPHALEXIN 500 MG PO CAPS
500.0000 mg | ORAL_CAPSULE | Freq: Three times a day (TID) | ORAL | 0 refills | Status: AC
Start: 2023-03-14 — End: 2023-03-21

## 2023-03-14 NOTE — Discharge Instructions (Signed)
Refer to the attached instructions on how to take care of your stitches.   Please call [phone] to schedule an appointment with Dr. Jearld Fenton, nose and throat doctor who can further manage your broken nose and reevaluate the cut on your forehead.  Will also need stitches removed in 7 to 10 days.  If you cannot make an ENT appointment within this timeframe you can either make an appointment with your primary care doctor, urgent care, or come to the ED to have them removed.   Sending a 3-day supply for as needed Norco for fracture related pain.  These only take this medication if your symptoms do not improve with Tylenol Motrin.  Do not take this medication and drive or go to work as it can make you sleepy.  Please also pick up your antibiotics.  This is to prevent infections in your nose.  To the ED if you develop fevers, worsening swelling, worsening pain, drainage, or any new or concerning symptoms
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
## 2023-03-14 NOTE — ED Triage Notes (Addendum)
Error
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
## 2023-03-27 ENCOUNTER — Other Ambulatory Visit: Payer: Self-pay

## 2023-03-27 ENCOUNTER — Emergency Department (HOSPITAL_COMMUNITY)
Admission: EM | Admit: 2023-03-27 | Discharge: 2023-03-27 | Disposition: A | Discharge status: Home / Self Care | Payer: No Typology Code available for payment source | Attending: Emergency Medicine | Admitting: Emergency Medicine

## 2023-03-27 ENCOUNTER — Encounter (HOSPITAL_COMMUNITY): Payer: Self-pay | Admitting: Emergency Medicine

## 2023-03-27 DIAGNOSIS — Z4802 Encounter for removal of sutures: Secondary | ICD-10-CM | POA: Insufficient documentation

## 2023-03-27 NOTE — ED Triage Notes (Signed)
Pt reports he is here for suture removal. Pt also reporting his "tailbone" is hurting.
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
## 2023-09-30 ENCOUNTER — Emergency Department
Admission: EM | Admit: 2023-09-30 | Discharge: 2023-09-30 | Disposition: A | Discharge status: Home / Self Care | Attending: Emergency Medicine | Admitting: Emergency Medicine

## 2023-09-30 ENCOUNTER — Other Ambulatory Visit: Payer: Self-pay

## 2023-09-30 ENCOUNTER — Emergency Department

## 2023-09-30 DIAGNOSIS — R109 Unspecified abdominal pain: Secondary | ICD-10-CM | POA: Diagnosis present

## 2023-09-30 DIAGNOSIS — F1090 Alcohol use, unspecified, uncomplicated: Secondary | ICD-10-CM | POA: Diagnosis not present

## 2023-09-30 DIAGNOSIS — N132 Hydronephrosis with renal and ureteral calculous obstruction: Secondary | ICD-10-CM | POA: Insufficient documentation

## 2023-09-30 DIAGNOSIS — Y909 Presence of alcohol in blood, level not specified: Secondary | ICD-10-CM | POA: Diagnosis not present

## 2023-09-30 DIAGNOSIS — N200 Calculus of kidney: Secondary | ICD-10-CM

## 2023-09-30 LAB — URINALYSIS, ROUTINE W REFLEX MICROSCOPIC
Bacteria, UA: NONE SEEN
Bilirubin Urine: NEGATIVE
Glucose, UA: NEGATIVE mg/dL
Ketones, ur: NEGATIVE mg/dL
Leukocytes,Ua: NEGATIVE
Nitrite: NEGATIVE
Protein, ur: NEGATIVE mg/dL
RBC / HPF: >50 RBC/hpf (ref 0–5)
Specific Gravity, Urine: 1.021 (ref 1.005–1.030)
pH: 5 (ref 5.0–8.0)

## 2023-09-30 LAB — HEPATIC FUNCTION PANEL
ALT: 52 U/L — ABNORMAL HIGH (ref 0–44)
AST: 29 U/L (ref 15–41)
Albumin: 4.5 g/dL (ref 3.5–5.0)
Alkaline Phosphatase: 77 U/L (ref 38–126)
Bilirubin, Direct: 0.2 mg/dL (ref 0.0–0.2)
Indirect Bilirubin: 1.7 mg/dL — ABNORMAL HIGH (ref 0.3–0.9)
Total Bilirubin: 1.9 mg/dL — ABNORMAL HIGH (ref 0.0–1.2)
Total Protein: 7.2 g/dL (ref 6.5–8.1)

## 2023-09-30 LAB — BASIC METABOLIC PANEL WITH GFR
Anion gap: 8 (ref 5–15)
BUN: 15 mg/dL (ref 6–20)
CO2: 27 mmol/L (ref 22–32)
Calcium: 9.1 mg/dL (ref 8.9–10.3)
Chloride: 104 mmol/L (ref 98–111)
Creatinine, Ser: 1.03 mg/dL (ref 0.61–1.24)
GFR, Estimated: >60 mL/min (ref >60)
Glucose, Bld: 104 mg/dL — ABNORMAL HIGH (ref 70–99)
Potassium: 3.7 mmol/L (ref 3.5–5.1)
Sodium: 139 mmol/L (ref 135–145)

## 2023-09-30 LAB — CBC
HCT: 47 % (ref 39.0–52.0)
Hemoglobin: 16.3 g/dL (ref 13.0–17.0)
MCH: 31.8 pg (ref 26.0–34.0)
MCHC: 34.7 g/dL (ref 30.0–36.0)
MCV: 91.6 fL (ref 80.0–100.0)
Platelets: 187 10*3/uL (ref 150–400)
RBC: 5.13 MIL/uL (ref 4.22–5.81)
RDW: 15 % (ref 11.5–15.5)
WBC: 10.4 10*3/uL (ref 4.0–10.5)
nRBC: 0 % (ref 0.0–0.2)

## 2023-09-30 LAB — LIPASE, BLOOD: Lipase: 62 U/L — ABNORMAL HIGH (ref 11–51)

## 2023-09-30 MED ORDER — ONDANSETRON 4 MG PO TBDP
4.0000 mg | ORAL_TABLET | Freq: Three times a day (TID) | ORAL | 0 refills | Status: AC | PRN
Start: 2023-09-30 — End: ?

## 2023-09-30 MED ORDER — ONDANSETRON HCL 4 MG/2ML IJ SOLN
4.0000 mg | Freq: Once | INTRAMUSCULAR | Status: AC
  Administered 2023-09-30: 4 mg via INTRAVENOUS
  Filled 2023-09-30: qty 2

## 2023-09-30 MED ORDER — OXYCODONE-ACETAMINOPHEN 5-325 MG PO TABS: 1.0000 | ORAL_TABLET | Freq: Four times a day (QID) | ORAL | 0 refills | Status: AC | PRN

## 2023-09-30 MED ORDER — KETOROLAC TROMETHAMINE 15 MG/ML IJ SOLN
15.0000 mg | Freq: Once | INTRAMUSCULAR | Status: AC
  Administered 2023-09-30: 15 mg via INTRAVENOUS
  Filled 2023-09-30: qty 1

## 2023-09-30 MED ORDER — MORPHINE SULFATE (PF) 4 MG/ML IV SOLN
4.0000 mg | Freq: Once | INTRAVENOUS | Status: AC
  Administered 2023-09-30: 4 mg via INTRAVENOUS
  Filled 2023-09-30: qty 1

## 2023-09-30 MED ORDER — PANTOPRAZOLE SODIUM 40 MG IV SOLR
40.0000 mg | Freq: Once | INTRAVENOUS | Status: AC
  Administered 2023-09-30: 40 mg via INTRAVENOUS
  Filled 2023-09-30: qty 10

## 2023-09-30 MED ORDER — SODIUM CHLORIDE 0.9 % IV BOLUS
500.0000 mL | Freq: Once | INTRAVENOUS | Status: AC
  Administered 2023-09-30: 500 mL via INTRAVENOUS

## 2023-09-30 MED ORDER — HYDROMORPHONE HCL 1 MG/ML IJ SOLN
0.5000 mg | Freq: Once | INTRAMUSCULAR | Status: AC
  Administered 2023-09-30: 0.5 mg via INTRAVENOUS
  Filled 2023-09-30: qty 0.5

## 2023-09-30 MED ORDER — OMEPRAZOLE MAGNESIUM 20 MG PO TBEC: 20.0000 mg | DELAYED_RELEASE_TABLET | Freq: Every day | ORAL | 0 refills | Status: AC

## 2023-09-30 MED ORDER — TAMSULOSIN HCL 0.4 MG PO CAPS: 0.4000 mg | ORAL_CAPSULE | Freq: Every day | ORAL | 0 refills | Status: AC

## 2023-09-30 NOTE — ED Notes (Signed)
 Pt is CAOx4, breathing normally, and normal in color. Pt is complaining of right flank pain and associates the pain with his past kidney stone pain. Pt states he "just needs a shot of Toradol  because that's all that works". Pt in NAD at this time.

## 2023-09-30 NOTE — Discharge Instructions (Addendum)
 You are seen in the emergency department for kidney stones.  You had a CT scan that showed findings concerning for right sided kidney stones.  Call and follow-up with urology.  You are given a prescription for pain medication, Flomax  and nausea medication.  Stay hydrated and drink plenty of water.  Return to the emergency department for fever or worsening pain.  You have a 5 mm kidney stone.  Pain control: ** do not use motrin  for 48 hours prior to Thursday if needing lithotripsy** Ibuprofen  (motrin /aleve/advil ) - You can take 3 tablets (600 mg) every 6 hours as needed for pain/fever.  Acetaminophen  (tylenol ) - You can take 2 extra strength tablets (1000 mg) every 6 hours as needed for pain/fever.  You can alternate these medications or take them together.  Make sure you eat food/drink water when taking these medications.  You were given a prescription for narcotic pain medications.  Take only if in severe pain.  These are very addictive medications.  These medications can make you constipated.  If you need to take more than 1-2 doses, start a stool softner.  If you become constipated, take 1 capfull of MiraLAX, can repeat untill having regular bowel movements.  Keep this medication out of reach of any children.  zofran  (ondansetron ) - nausea medication, take 1 tablet every 8 hours as needed for nausea/vomiting.

## 2023-09-30 NOTE — ED Triage Notes (Signed)
 Pt presents to ED with c/o sided R flank pain that started 2 hours ago, pt states HX of kidney stones. Pt endorses hematuria. NAD noted.

## 2023-09-30 NOTE — ED Provider Notes (Addendum)
 [facility] Provider Note    Event Date/Time   First MD Initiated Contact with Patient 09/30/23 [phone]     (approximate)   History   Flank Pain   HPI  Tyler Hughes is a 38 y.o. male past medical history significant for kidney stone who presents to the emergency department with right-sided flank pain.  States that he started having hematuria last night and started having right flank pain today.  States that he is have a history of kidney stone and this feels similar to prior episodes of kidney stone.  Endorses nausea but no episodes of vomiting.  Did not take anything for the pain.  Prior lithotripsy with urology.  Denies any chest pain or shortness of breath.  Denies any falls or trauma.  No fever.     Physical Exam   Triage Vital Signs: ED Triage Vitals  Encounter Vitals Group     BP 09/30/23 0852 (!) 179/104     Systolic BP Percentile --      Diastolic BP Percentile --      Pulse Rate 09/30/23 0852 61     Resp 09/30/23 0852 19     Temp 09/30/23 0852 97.7 F (36.5 C)     Temp Source 09/30/23 0852 Oral     SpO2 09/30/23 0852 99 %     Weight 09/30/23 0849 160 lb (72.6 kg)     Height 09/30/23 0849 5\' 7"  (1.702 m)     Head Circumference --      Peak Flow --      Pain Score 09/30/23 0849 6     Pain Loc --      Pain Education --      Exclude from Growth Chart --     Most recent vital signs: Vitals:   09/30/23 0852 09/30/23 1204  BP: (!) 179/104 (!) 161/95  Pulse: 61 60  Resp: 19 18  Temp: 97.7 F (36.5 C)   SpO2: 99% 100%    Physical Exam Constitutional:      Appearance: He is well-developed.  HENT:     Head: Atraumatic.  Eyes:     Conjunctiva/sclera: Conjunctivae normal.  Cardiovascular:     Rate and Rhythm: Regular rhythm.  Pulmonary:     Effort: No respiratory distress.  Abdominal:     Tenderness: There is abdominal tenderness. There is right CVA tenderness.  Musculoskeletal:        General: Normal range of motion.      Cervical back: Normal range of motion.     Right lower leg: No edema.     Left lower leg: No edema.  Skin:    General: Skin is warm.     Capillary Refill: Capillary refill takes less than 2 seconds.  Neurological:     Mental Status: He is alert. Mental status is at baseline.     IMPRESSION / MDM / ASSESSMENT AND PLAN / ED COURSE  I reviewed the triage vital signs and the nursing notes.  Differential diagnosis including kidney stone, pyelonephritis, musculoskeletal strain  RADIOLOGY I independently reviewed imaging, my interpretation of imaging: CT scan stone study -right-sided hydronephrosis with obstructing kidney stone.  Read as obstructing kidney stone with hydronephrosis.  5 mm kidney stone  LABS (all labs ordered are listed, but only abnormal results are displayed) Labs interpreted as -    Labs Reviewed  BASIC METABOLIC PANEL WITH GFR - Abnormal; Notable for the following components:      Result  Value   Glucose, Bld 104 (*)    All other components within normal limits  URINALYSIS, ROUTINE W REFLEX MICROSCOPIC - Abnormal; Notable for the following components:   Color, Urine YELLOW (*)    APPearance HAZY (*)    Hgb urine dipstick LARGE (*)    All other components within normal limits  HEPATIC FUNCTION PANEL - Abnormal; Notable for the following components:   ALT 52 (*)    Total Bilirubin 1.9 (*)    Indirect Bilirubin 1.7 (*)    All other components within normal limits  LIPASE, BLOOD - Abnormal; Notable for the following components:   Lipase 62 (*)    All other components within normal limits  CBC     MDM  Patient was given IV fluids, Zofran  and ketorolac   Reevaluation ongoing pains was given IV morphine   Continued to have pain, given IV Dilaudid .  On reevaluation had significant improvement of symptoms.  No episodes of nausea or vomiting and does state he is feeling better.  Patient diagnosed with a 5 mm kidney stone.  Has required lithotripsy and stone  retrieval in the past with urology.  Discussed calling urology today to schedule close follow-up appointment and that he may need lithotripsy if he is unable to pass the stone.  Discussed if he did need lithotripsy to hold any NSAIDs for 48 hours prior to lithotripsy which is done on Thursday at this facility.  Discussed return to the emergency department for fever or any worsening pain.  Will do a short course of Flomax , Zofran  and Percocet.  At time of discharge also complaining of symptoms of acid reflux.  Given IV Protonix and will start on omeprazole.  Patient has daily alcohol use of greater than 2 beers a day.  Suggested alcohol cessation and cutting back on drinking however patient was not interested at this time.  Discussed close follow-up with primary care physician and neurology.  Discussed return to the emergency department for any worsening symptoms.     PROCEDURES:  Critical Care performed: No  Procedures  Patient's presentation is most consistent with acute complicated illness / injury requiring diagnostic workup.   MEDICATIONS ORDERED IN ED: Medications  ketorolac  (TORADOL ) 15 MG/ML injection 15 mg (15 mg Intravenous Given 09/30/23 0921)  sodium chloride  0.9 % bolus 500 mL (0 mLs Intravenous Stopped 09/30/23 0946)  ondansetron  (ZOFRAN ) injection 4 mg (4 mg Intravenous Given 09/30/23 0921)  morphine  (PF) 4 MG/ML injection 4 mg (4 mg Intravenous Given 09/30/23 1114)  HYDROmorphone  (DILAUDID ) injection 0.5 mg (0.5 mg Intravenous Given 09/30/23 1203)  pantoprazole (PROTONIX) injection 40 mg (40 mg Intravenous Given 09/30/23 1246)    FINAL CLINICAL IMPRESSION(S) / ED DIAGNOSES   Final diagnoses:  Right flank pain  Kidney stone  Alcohol use     Rx / DC Orders   ED Discharge Orders          Ordered    tamsulosin  (FLOMAX ) 0.4 MG CAPS capsule  Daily        09/30/23 0910    ondansetron  (ZOFRAN -ODT) 4 MG disintegrating tablet  Every 8 hours PRN        09/30/23 0910     oxyCODONE -acetaminophen  (PERCOCET) 5-325 MG tablet  Every 6 hours PRN        09/30/23 1154    Ambulatory Referral to Primary Care (Establish Care)        09/30/23 1235    omeprazole (PRILOSEC OTC) 20 MG tablet  Daily  09/30/23 1239             Note:  This document was prepared using Dragon voice recognition software and may include unintentional dictation errors.   Viviano Ground, MD 09/30/23 1236    Viviano Ground, MD 09/30/23 1252
